# Patient Record
Sex: Male | Born: 1945 | Race: White | Hispanic: No | Marital: Married | Smoking: Never smoker
Health system: Southern US, Community
[De-identification: ages and names within clinical notes are randomized; demographics above are authoritative.]

## PROBLEM LIST (undated history)

## (undated) DIAGNOSIS — C4359 Malignant melanoma of other part of trunk: Secondary | ICD-10-CM

## (undated) DIAGNOSIS — C4339 Malignant melanoma of other parts of face: Secondary | ICD-10-CM

## (undated) DIAGNOSIS — K219 Gastro-esophageal reflux disease without esophagitis: Secondary | ICD-10-CM

## (undated) DIAGNOSIS — M5136 Other intervertebral disc degeneration, lumbar region: Secondary | ICD-10-CM

## (undated) DIAGNOSIS — I10 Essential (primary) hypertension: Secondary | ICD-10-CM

## (undated) DIAGNOSIS — M51369 Other intervertebral disc degeneration, lumbar region without mention of lumbar back pain or lower extremity pain: Secondary | ICD-10-CM

## (undated) DIAGNOSIS — E785 Hyperlipidemia, unspecified: Secondary | ICD-10-CM

## (undated) DIAGNOSIS — R062 Wheezing: Secondary | ICD-10-CM

## (undated) DIAGNOSIS — M1990 Unspecified osteoarthritis, unspecified site: Secondary | ICD-10-CM

## (undated) DIAGNOSIS — C4330 Malignant melanoma of unspecified part of face: Secondary | ICD-10-CM

## (undated) DIAGNOSIS — M5126 Other intervertebral disc displacement, lumbar region: Secondary | ICD-10-CM

## (undated) HISTORY — PX: MELANOMA EXCISION: SHX5266

## (undated) HISTORY — DX: Essential (primary) hypertension: I10

## (undated) HISTORY — DX: Unspecified osteoarthritis, unspecified site: M19.90

## (undated) HISTORY — PX: CARDIAC CATHETERIZATION: SHX172

## (undated) HISTORY — DX: Hyperlipidemia, unspecified: E78.5

## (undated) HISTORY — PX: INGUINAL HERNIA REPAIR: SUR1180

---

## 1999-11-22 ENCOUNTER — Emergency Department (HOSPITAL_COMMUNITY): Admission: EM | Admit: 1999-11-22 | Discharge: 1999-11-22 | Payer: Self-pay | Admitting: Emergency Medicine

## 1999-11-22 ENCOUNTER — Encounter: Payer: Self-pay | Admitting: Emergency Medicine

## 2000-05-20 ENCOUNTER — Encounter (INDEPENDENT_AMBULATORY_CARE_PROVIDER_SITE_OTHER): Payer: Self-pay

## 2000-05-20 ENCOUNTER — Ambulatory Visit (HOSPITAL_COMMUNITY): Admission: RE | Admit: 2000-05-20 | Discharge: 2000-05-20 | Payer: Self-pay | Admitting: Gastroenterology

## 2001-02-24 ENCOUNTER — Emergency Department (HOSPITAL_COMMUNITY): Admission: EM | Admit: 2001-02-24 | Discharge: 2001-02-24 | Payer: Self-pay | Admitting: Emergency Medicine

## 2004-06-21 ENCOUNTER — Emergency Department (HOSPITAL_COMMUNITY): Admission: EM | Admit: 2004-06-21 | Discharge: 2004-06-21 | Payer: Self-pay | Admitting: Emergency Medicine

## 2005-10-05 ENCOUNTER — Emergency Department (HOSPITAL_COMMUNITY): Admission: EM | Admit: 2005-10-05 | Discharge: 2005-10-06 | Payer: Self-pay | Admitting: Emergency Medicine

## 2006-09-02 ENCOUNTER — Encounter: Admission: RE | Admit: 2006-09-02 | Discharge: 2006-09-02 | Payer: Self-pay | Admitting: Internal Medicine

## 2007-03-20 ENCOUNTER — Encounter: Admission: RE | Admit: 2007-03-20 | Discharge: 2007-03-20 | Payer: Self-pay | Admitting: Internal Medicine

## 2007-03-26 ENCOUNTER — Encounter (INDEPENDENT_AMBULATORY_CARE_PROVIDER_SITE_OTHER): Payer: Self-pay | Admitting: Specialist

## 2007-03-26 ENCOUNTER — Encounter: Admission: RE | Admit: 2007-03-26 | Discharge: 2007-03-26 | Payer: Self-pay | Admitting: Internal Medicine

## 2008-09-19 ENCOUNTER — Encounter: Payer: Self-pay | Admitting: Internal Medicine

## 2009-09-27 ENCOUNTER — Ambulatory Visit: Payer: Self-pay | Admitting: Internal Medicine

## 2009-09-27 DIAGNOSIS — R0602 Shortness of breath: Secondary | ICD-10-CM

## 2009-10-10 ENCOUNTER — Telehealth (INDEPENDENT_AMBULATORY_CARE_PROVIDER_SITE_OTHER): Payer: Self-pay | Admitting: *Deleted

## 2009-10-11 ENCOUNTER — Ambulatory Visit: Payer: Self-pay | Admitting: Cardiology

## 2009-10-11 ENCOUNTER — Ambulatory Visit (HOSPITAL_COMMUNITY): Admission: RE | Admit: 2009-10-11 | Discharge: 2009-10-11 | Payer: Self-pay | Admitting: Internal Medicine

## 2009-10-11 ENCOUNTER — Encounter (HOSPITAL_COMMUNITY): Admission: RE | Admit: 2009-10-11 | Discharge: 2009-12-15 | Payer: Self-pay | Admitting: Internal Medicine

## 2009-10-11 ENCOUNTER — Ambulatory Visit: Payer: Self-pay

## 2009-10-11 ENCOUNTER — Encounter: Payer: Self-pay | Admitting: Internal Medicine

## 2009-12-16 HISTORY — PX: TOTAL KNEE ARTHROPLASTY: SHX125

## 2010-07-03 ENCOUNTER — Inpatient Hospital Stay (HOSPITAL_COMMUNITY): Admission: RE | Admit: 2010-07-03 | Discharge: 2010-07-08 | Payer: Self-pay | Admitting: Orthopedic Surgery

## 2011-02-18 ENCOUNTER — Other Ambulatory Visit: Payer: Self-pay | Admitting: Dermatology

## 2011-03-02 LAB — CBC
HCT: 27.7 % — ABNORMAL LOW (ref 39.0–52.0)
HCT: 29.5 % — ABNORMAL LOW (ref 39.0–52.0)
HCT: 32.3 % — ABNORMAL LOW (ref 39.0–52.0)
Hemoglobin: 10.1 g/dL — ABNORMAL LOW (ref 13.0–17.0)
Hemoglobin: 11 g/dL — ABNORMAL LOW (ref 13.0–17.0)
Hemoglobin: 9.4 g/dL — ABNORMAL LOW (ref 13.0–17.0)
MCH: 33.5 pg (ref 26.0–34.0)
MCH: 33.6 pg (ref 26.0–34.0)
MCH: 33.6 pg (ref 26.0–34.0)
MCHC: 34 g/dL (ref 30.0–36.0)
MCHC: 34 g/dL (ref 30.0–36.0)
MCHC: 34.2 g/dL (ref 30.0–36.0)
MCV: 98.2 fL (ref 78.0–100.0)
MCV: 98.8 fL (ref 78.0–100.0)
MCV: 98.8 fL (ref 78.0–100.0)
Platelets: 109 10*3/uL — ABNORMAL LOW (ref 150–400)
Platelets: 114 10*3/uL — ABNORMAL LOW (ref 150–400)
Platelets: 136 10*3/uL — ABNORMAL LOW (ref 150–400)
RBC: 2.81 MIL/uL — ABNORMAL LOW (ref 4.22–5.81)
RBC: 3.01 MIL/uL — ABNORMAL LOW (ref 4.22–5.81)
RBC: 3.27 MIL/uL — ABNORMAL LOW (ref 4.22–5.81)
RDW: 13 % (ref 11.5–15.5)
RDW: 13.1 % (ref 11.5–15.5)
RDW: 13.4 % (ref 11.5–15.5)
WBC: 7.5 10*3/uL (ref 4.0–10.5)
WBC: 7.6 10*3/uL (ref 4.0–10.5)
WBC: 8.7 10*3/uL (ref 4.0–10.5)

## 2011-03-02 LAB — BASIC METABOLIC PANEL
BUN: 10 mg/dL (ref 6–23)
BUN: 12 mg/dL (ref 6–23)
CO2: 28 mEq/L (ref 19–32)
CO2: 29 mEq/L (ref 19–32)
Calcium: 8 mg/dL — ABNORMAL LOW (ref 8.4–10.5)
Calcium: 8 mg/dL — ABNORMAL LOW (ref 8.4–10.5)
Chloride: 101 mEq/L (ref 96–112)
Chloride: 101 mEq/L (ref 96–112)
Creatinine, Ser: 0.77 mg/dL (ref 0.4–1.5)
Creatinine, Ser: 0.94 mg/dL (ref 0.4–1.5)
GFR calc Af Amer: >60 mL/min (ref >60)
GFR calc Af Amer: >60 mL/min (ref >60)
GFR calc non Af Amer: >60 mL/min (ref >60)
GFR calc non Af Amer: >60 mL/min (ref >60)
Glucose, Bld: 124 mg/dL — ABNORMAL HIGH (ref 70–99)
Glucose, Bld: 133 mg/dL — ABNORMAL HIGH (ref 70–99)
Potassium: 3.7 mEq/L (ref 3.5–5.1)
Potassium: 4.1 mEq/L (ref 3.5–5.1)
Sodium: 135 mEq/L (ref 135–145)
Sodium: 138 mEq/L (ref 135–145)

## 2011-03-02 LAB — PROTIME-INR
INR: 1.11 (ref 0.00–1.49)
INR: 1.58 — ABNORMAL HIGH (ref 0.00–1.49)
INR: 2.09 — ABNORMAL HIGH (ref 0.00–1.49)
INR: 2.12 — ABNORMAL HIGH (ref 0.00–1.49)
INR: 2.5 — ABNORMAL HIGH (ref 0.00–1.49)
Prothrombin Time: 14.2 seconds (ref 11.6–15.2)
Prothrombin Time: 18.7 seconds — ABNORMAL HIGH (ref 11.6–15.2)
Prothrombin Time: 23.3 seconds — ABNORMAL HIGH (ref 11.6–15.2)
Prothrombin Time: 23.6 seconds — ABNORMAL HIGH (ref 11.6–15.2)
Prothrombin Time: 26.8 seconds — ABNORMAL HIGH (ref 11.6–15.2)

## 2011-03-03 LAB — APTT: aPTT: 26 seconds (ref 24–37)

## 2011-03-03 LAB — ABO/RH: ABO/RH(D): O POS

## 2011-03-03 LAB — URINALYSIS, ROUTINE W REFLEX MICROSCOPIC
Glucose, UA: NEGATIVE mg/dL
Hgb urine dipstick: NEGATIVE
Specific Gravity, Urine: 1.021 (ref 1.005–1.030)
Urobilinogen, UA: 4 mg/dL — ABNORMAL HIGH (ref 0.0–1.0)

## 2011-03-03 LAB — URINE CULTURE: Culture: NO GROWTH

## 2011-03-03 LAB — DIFFERENTIAL
Basophils Absolute: 0 10*3/uL (ref 0.0–0.1)
Basophils Relative: 0 % (ref 0–1)
Eosinophils Relative: 2 % (ref 0–5)
Lymphocytes Relative: 27 % (ref 12–46)
Monocytes Absolute: 0.5 10*3/uL (ref 0.1–1.0)
Neutro Abs: 4.9 10*3/uL (ref 1.7–7.7)

## 2011-03-03 LAB — TYPE AND SCREEN: ABO/RH(D): O POS

## 2011-03-03 LAB — CBC
MCHC: 34.1 g/dL (ref 30.0–36.0)
Platelets: 187 10*3/uL (ref 150–400)
RDW: 13.3 % (ref 11.5–15.5)
WBC: 7.7 10*3/uL (ref 4.0–10.5)

## 2011-03-03 LAB — SURGICAL PCR SCREEN: Staphylococcus aureus: NEGATIVE

## 2011-03-03 LAB — BASIC METABOLIC PANEL
BUN: 15 mg/dL (ref 6–23)
Creatinine, Ser: 0.9 mg/dL (ref 0.4–1.5)
GFR calc non Af Amer: >60 mL/min (ref >60)
Glucose, Bld: 109 mg/dL — ABNORMAL HIGH (ref 70–99)

## 2011-03-03 LAB — PROTIME-INR: Prothrombin Time: 12.8 seconds (ref 11.6–15.2)

## 2011-05-03 NOTE — Procedures (Signed)
[facility]  Patient:    Stuart Haynes, Stuart Haynes                      MRN: [id number] Proc. Date: 05/20/00 Adm. Date:  [id number] Disc. Date: [id number] Attending:  Nelda Marseille CC:         Petra Kuba, M.D.             Richard A. Jacky Kindle, M.D.                           Procedure Report  PROCEDURE:  Colonoscopy with biopsy.  INDICATION:  Family history of colon cancer, colon polyps.  Consent was signed after risks, benefits, methods and options thoroughly discussed in the office.  MEDICINES USED:  Demerol 70 mg, Versed 8 mg.  DESCRIPTION OF PROCEDURE:  Rectal examination was pertinent for external hemorrhoids.  Digital exam was negative.  Video colonoscope was inserted, easily advanced around the colon to the cecum.  This did not require any position changes or any abdominal pressure.  The cecum was identified by the appendiceal orifice and the ileocecal valve.  In fact, the scope was inserted a short ways into the terminal ileum, which was normal.  Photo documentation was obtained.  The scope was slowly withdrawn.  Prep was adequate.  He did have a fair amount of liquid stool that required a fair amount of washing and suctioning, but adequate visualization was obtained.  As the scope was slowly withdrawn, in the ascending and also approximately in the splenic flexure, two edematous folds were seen.  I did not think they were polypoid but went ahead and took a few colon biopsies just to make sure.  Other than some left-sided diverticula, no other abnormalities were seen as we slowly withdrew back to the rectum.  Once back in the rectum, the scope was then retroflexed, pertinent for some internal hemorrhoids.  The scope was straightened and re-advanced a short ways around the sigmoid.  Air was suctioned, the scope removed.  The patient tolerated the procedure well.  There was no obvious immediate complication.  ENDOSCOPIC DIAGNOSES: 1. Internal and  external hemorrhoids. 2. Left-sided diverticula, mild to moderate. 3. Two questionable edematous folds, doubtful polyps, in the splenic flexure    and the ascending, status post cold biopsy. 4. Otherwise within normal limits to the terminal ileum.  PLAN:  Yearly rectals and guaiacs per Dr. Jacky Kindle.  GI follow-up p.r.n. Otherwise, probably check colonic screening in five years. DD:  05/20/00 TD:  05/23/00 Job: [id number]

## 2011-05-27 ENCOUNTER — Other Ambulatory Visit: Payer: Self-pay | Admitting: Gastroenterology

## 2011-08-27 ENCOUNTER — Other Ambulatory Visit: Payer: Self-pay | Admitting: Dermatology

## 2011-10-21 ENCOUNTER — Encounter: Payer: Self-pay | Admitting: *Deleted

## 2011-10-21 ENCOUNTER — Encounter: Payer: Self-pay | Admitting: Cardiovascular Disease

## 2011-10-21 ENCOUNTER — Ambulatory Visit (INDEPENDENT_AMBULATORY_CARE_PROVIDER_SITE_OTHER): Payer: Medicare Other | Admitting: Cardiovascular Disease

## 2011-10-21 DIAGNOSIS — R079 Chest pain, unspecified: Secondary | ICD-10-CM | POA: Insufficient documentation

## 2011-10-21 NOTE — Assessment & Plan Note (Signed)
Chest pain in a patient with obesity, HTN, HLD and stress test 2010 showing inferior scar with mild ischemia in the inferior wall. Now having daily chest pain. Will arrange left heart cath to exclude CAD in outpt cath lab on 10/30/11. R/B reviewed with pt. He agrees to proceed. Labs next week.

## 2011-10-21 NOTE — Progress Notes (Signed)
History of Present Illness:65 yo WM with history of obesity, HTN, HLD and osteoarthritis here for cardiac follow up. He has been followed in the past by Dr. Bensimhon. He was seen by Dr Bensimhon in October 2010 for further evaluation of dyspnea and pre-op evaluation for possible L knee replacement. At that time he c/o intermittent CP. Stress myoview October 2010 with inferior wall scar with mild ischemia, EF of 66%. It was felt to be a low risk study. Echo showed mild LVH but no significant valvular issues.   He has been lost to follow up for the last 2 years. He returns today for evaluation of chest pain. He tells me that he his having daily chest pains. It is a pressure sensation in the center of his chest. It occurs at rest and with exertion. No associated SOB but he does feel shortness of breath with minimal exertion.     Past Medical History  Diagnosis Date  . Arthritis   . HTN (hypertension)   . Hyperlipidemia     Past Surgical History  Procedure Date  . Total knee arthroplasty 2011    Left  . Hernia repair     Current Outpatient Prescriptions  Medication Sig Dispense Refill  . amLODipine (NORVASC) 10 MG tablet Take 10 mg by mouth daily.        . aspirin 81 MG tablet Take 81 mg by mouth daily.        . doxazosin (CARDURA) 4 MG tablet Take 4 mg by mouth at bedtime.        . fish oil-omega-3 fatty acids 1000 MG capsule Take 2 g by mouth daily.        . Glucosamine 500 MG CAPS Take by mouth. 2 tabs daily       . lisinopril-hydrochlorothiazide (PRINZIDE,ZESTORETIC) 20-12.5 MG per tablet Take 1 tablet by mouth daily.        . Naproxen Sodium (ALEVE) 220 MG CAPS Take by mouth. As  needeed       . oxyCODONE-acetaminophen (PERCOCET) 5-325 MG per tablet Take 1 tablet by mouth every 4 (four) hours as needed.        . simvastatin (ZOCOR) 20 MG tablet Take 20 mg by mouth at bedtime.          Allergies  Allergen Reactions  . Ivp Dye (Iodinated Diagnostic Agents)     History   Social  History  . Marital Status: Married    Spouse Name: N/A    Number of Children: 2  . Years of Education: N/A   Occupational History  . Retired [employer]    Social History Main Topics  . Smoking status: Never Smoker   . Smokeless tobacco: Not on file  . Alcohol Use: Yes  . Drug Use: No  . Sexually Active: Not on file   Other Topics Concern  . Not on file   Social History Narrative  . No narrative on file    Family History  Problem Relation Age of Onset  . Colon cancer Mother   . Cancer Father     Review of Systems:  As stated in the HPI and otherwise negative.   BP 137/79  Pulse 98  Ht 6' (1.829 m)  Wt 309 lb (140.161 kg)  BMI 41.91 kg/m2  Physical Examination: General: Well developed, well nourished, NAD HEENT: OP clear, mucus membranes moist SKIN: warm, dry. No rashes. Neuro: No focal deficits Musculoskeletal: Muscle strength 5/5 all ext Psychiatric: Mood and affect normal Neck:   No JVD, no carotid bruits, no thyromegaly, no lymphadenopathy. Lungs:Clear bilaterally, no wheezes, rhonci, crackles Cardiovascular: Regular rate and rhythm. No murmurs, gallops or rubs. Abdomen:Soft. Bowel sounds present. Non-tender.  Extremities: 1+ bilateral lower  extremity edema. Pulses are 1-2 + in the bilateral DP/PT.  EKG:NSR, rate 98 bpm. Non-specific ST changes.  

## 2011-10-21 NOTE — Patient Instructions (Addendum)
Your physician recommends that you schedule a follow-up appointment in: 3 weeks  Your physician has requested that you have a cardiac catheterization. Cardiac catheterization is used to diagnose and/or treat various heart conditions. Doctors may recommend this procedure for a number of different reasons. The most common reason is to evaluate chest pain. Chest pain can be a symptom of coronary artery disease (CAD), and cardiac catheterization can show whether plaque is narrowing or blocking your heart's arteries. This procedure is also used to evaluate the valves, as well as measure the blood flow and oxygen levels in different parts of your heart. For further information please visit [email or web address] Please follow instruction sheet, as given. Scheduled for October 30, 2011.  Your physician recommends that you return for lab work on Monday, October 28, 2011--BMP, CBC, PT--786.5

## 2011-10-28 ENCOUNTER — Other Ambulatory Visit (INDEPENDENT_AMBULATORY_CARE_PROVIDER_SITE_OTHER): Payer: Medicare Other | Admitting: *Deleted

## 2011-10-28 DIAGNOSIS — R079 Chest pain, unspecified: Secondary | ICD-10-CM

## 2011-10-29 LAB — BASIC METABOLIC PANEL
BUN: 18 mg/dL (ref 6–23)
CO2: 25 mEq/L (ref 19–32)
Calcium: 9 mg/dL (ref 8.4–10.5)
Creatinine, Ser: 0.8 mg/dL (ref 0.4–1.5)
GFR: 109.04 mL/min (ref >60.00)
Glucose, Bld: 113 mg/dL — ABNORMAL HIGH (ref 70–99)

## 2011-10-29 LAB — CBC WITH DIFFERENTIAL/PLATELET
Basophils Relative: 0.3 % (ref 0.0–3.0)
Eosinophils Relative: 2.8 % (ref 0.0–5.0)
HCT: 40.4 % (ref 39.0–52.0)
Hemoglobin: 13.7 g/dL (ref 13.0–17.0)
Lymphs Abs: 2.5 10*3/uL (ref 0.7–4.0)
MCV: 99.2 fl (ref 78.0–100.0)
Monocytes Absolute: 0.6 10*3/uL (ref 0.1–1.0)
Monocytes Relative: 8.9 % (ref 3.0–12.0)
Neutro Abs: 3.7 10*3/uL (ref 1.4–7.7)
WBC: 7 10*3/uL (ref 4.5–10.5)

## 2011-10-29 LAB — PROTIME-INR: INR: 1 ratio (ref 0.8–1.0)

## 2011-10-29 MED ORDER — DIAZEPAM 5 MG PO TABS: 5.0000 mg | ORAL_TABLET | ORAL | Status: AC

## 2011-10-29 MED ORDER — ASPIRIN 81 MG PO CHEW: 324.0000 mg | CHEWABLE_TABLET | ORAL | Status: AC

## 2011-10-29 MED ORDER — SODIUM CHLORIDE 0.9 % IV SOLN: INTRAVENOUS | Status: DC

## 2011-10-31 ENCOUNTER — Inpatient Hospital Stay (HOSPITAL_BASED_OUTPATIENT_CLINIC_OR_DEPARTMENT_OTHER)
Admission: RE | Admit: 2011-10-31 | Discharge: 2011-10-31 | Disposition: A | Discharge status: Home / Self Care | Payer: Medicare Other | Source: Ambulatory Visit | Attending: Cardiovascular Disease | Admitting: Cardiovascular Disease

## 2011-10-31 ENCOUNTER — Encounter (HOSPITAL_BASED_OUTPATIENT_CLINIC_OR_DEPARTMENT_OTHER)
Admission: RE | Disposition: A | Discharge status: Home / Self Care | Payer: Self-pay | Source: Ambulatory Visit | Attending: Cardiovascular Disease

## 2011-10-31 ENCOUNTER — Encounter (HOSPITAL_BASED_OUTPATIENT_CLINIC_OR_DEPARTMENT_OTHER): Payer: Self-pay

## 2011-10-31 DIAGNOSIS — I2510 Atherosclerotic heart disease of native coronary artery without angina pectoris: Secondary | ICD-10-CM | POA: Insufficient documentation

## 2011-10-31 DIAGNOSIS — R0789 Other chest pain: Secondary | ICD-10-CM | POA: Insufficient documentation

## 2011-10-31 SURGERY — JV LEFT HEART CATHETERIZATION WITH CORONARY ANGIOGRAM
Anesthesia: Moderate Sedation

## 2011-10-31 MED ORDER — SODIUM CHLORIDE 0.9 % IV SOLN
250.0000 mL | INTRAVENOUS | Status: DC
  Administered 2011-10-31: 250 mL via INTRAVENOUS

## 2011-10-31 MED ORDER — ACETAMINOPHEN 325 MG PO TABS: 650.0000 mg | ORAL_TABLET | ORAL | Status: DC | PRN

## 2011-10-31 MED ORDER — ASPIRIN 81 MG PO CHEW
324.0000 mg | CHEWABLE_TABLET | Freq: Once | ORAL | Status: AC
  Administered 2011-10-31: 324 mg via ORAL

## 2011-10-31 MED ORDER — DIAZEPAM 5 MG PO TABS
5.0000 mg | ORAL_TABLET | Freq: Once | ORAL | Status: AC
  Administered 2011-10-31: 5 mg via ORAL

## 2011-10-31 MED ORDER — SODIUM CHLORIDE 0.9 % IV SOLN: INTRAVENOUS | Status: AC

## 2011-10-31 NOTE — H&P (View-Only) (Signed)
History of Present Illness:65 yo WM with history of obesity, HTN, HLD and osteoarthritis here for cardiac follow up. Stuart Haynes has been followed in the past by Dr. Gala Romney. Stuart Haynes was seen by Dr Gala Romney in October 2010 for further evaluation of dyspnea and pre-op evaluation for possible L knee replacement. At that time Stuart Haynes c/o intermittent CP. Stress myoview October 2010 with inferior wall scar with mild ischemia, EF of 66%. It was felt to be a low risk study. Echo showed mild LVH but no significant valvular issues.   Stuart Haynes has been lost to follow up for the last 2 years. Stuart Haynes returns today for evaluation of chest pain. Stuart Haynes tells me that Stuart Haynes his having daily chest pains. It is a pressure sensation in the center of his chest. It occurs at rest and with exertion. No associated SOB but Stuart Haynes does feel shortness of breath with minimal exertion.     Past Medical History  Diagnosis Date  . Arthritis   . HTN (hypertension)   . Hyperlipidemia     Past Surgical History  Procedure Date  . Total knee arthroplasty 2011    Left  . Hernia repair     Current Outpatient Prescriptions  Medication Sig Dispense Refill  . amLODipine (NORVASC) 10 MG tablet Take 10 mg by mouth daily.        Marland Kitchen aspirin 81 MG tablet Take 81 mg by mouth daily.        Marland Kitchen doxazosin (CARDURA) 4 MG tablet Take 4 mg by mouth at bedtime.        . fish oil-omega-3 fatty acids 1000 MG capsule Take 2 g by mouth daily.        . Glucosamine 500 MG CAPS Take by mouth. 2 tabs daily       . lisinopril-hydrochlorothiazide (PRINZIDE,ZESTORETIC) 20-12.5 MG per tablet Take 1 tablet by mouth daily.        . Naproxen Sodium (ALEVE) 220 MG CAPS Take by mouth. As  needeed       . oxyCODONE-acetaminophen (PERCOCET) 5-325 MG per tablet Take 1 tablet by mouth every 4 (four) hours as needed.        . simvastatin (ZOCOR) 20 MG tablet Take 20 mg by mouth at bedtime.          Allergies  Allergen Reactions  . Ivp Dye (Iodinated Diagnostic Agents)     History   Social  History  . Marital Status: Married    Spouse Name: N/A    Number of Children: 2  . Years of Education: N/A   Occupational History  . Retired [employer]    Social History Main Topics  . Smoking status: Never Smoker   . Smokeless tobacco: Not on file  . Alcohol Use: Yes  . Drug Use: No  . Sexually Active: Not on file   Other Topics Concern  . Not on file   Social History Narrative  . No narrative on file    Family History  Problem Relation Age of Onset  . Colon cancer Mother   . Cancer Father     Review of Systems:  As stated in the HPI and otherwise negative.   BP 137/79  Pulse 98  Ht 6' (1.829 m)  Wt 309 lb (140.161 kg)  BMI 41.91 kg/m2  Physical Examination: General: Well developed, well nourished, NAD HEENT: OP clear, mucus membranes moist SKIN: warm, dry. No rashes. Neuro: No focal deficits Musculoskeletal: Muscle strength 5/5 all ext Psychiatric: Mood and affect normal Neck:  No JVD, no carotid bruits, no thyromegaly, no lymphadenopathy. Lungs:Clear bilaterally, no wheezes, rhonci, crackles Cardiovascular: Regular rate and rhythm. No murmurs, gallops or rubs. Abdomen:Soft. Bowel sounds present. Non-tender.  Extremities: 1+ bilateral lower  extremity edema. Pulses are 1-2 + in the bilateral DP/PT.  EKG:NSR, rate 98 bpm. Non-specific ST changes.

## 2011-10-31 NOTE — OR Nursing (Signed)
Discharge instructions completed, given to patient.  Ambulated to bathroom without bleeding.  Site unchanged.  IV discontinued. Discharged via wheelchair to home.

## 2011-10-31 NOTE — OR Nursing (Signed)
Bedrest begins @ 1005.

## 2011-10-31 NOTE — Op Note (Signed)
Cardiac Catheterization Operative Report  Stuart Haynes [id number] 11/15/20129:52 AM No primary provider on file.  Procedure Performed:  1. Left Heart Catheterization 2. Selective Coronary Angiography 3. Left ventricular angiogram  Operator: Verne Carrow, MD  Indication:  Chest pain, abnormal myoview suggeting ischemia 2010 without f/u.                          Procedure Details: The risks, benefits, complications, treatment options, and expected outcomes were discussed with the patient. The patient and/or family concurred with the proposed plan, giving informed consent. The patient was brought to the outpt cath lab after IV hydration was begun and oral premedication was given. The patient was further sedated with Versed and Fentanyl. The right groin  was prepped and draped in the usual manner. Using the modified Seldinger access technique, a 4 French sheath was placed in the right femoral artery. Standard diagnostic catheters were used to perform selective coronary angiography. A pigtail catheter was used to perform a left ventricular angiogram. There were no immediate complications. The patient was taken to the recovery area in stable condition.    Hemodynamic Findings: Central aortic pressure: 102/61 Left ventricular pressure: 100/9/13  Angiographic Findings:  Left main: No disease.  Left Anterior Descending Artery: 20% plaque proximal. 30% mid stenosis. Diagonal branch is small to moderate sized with mild 30% plaque.   Circumflex Artery: Mild plaque.   Right Coronary Artery: 30% proximal stenosis, eccentric.   Left Ventricular Angiogram: LVEF 55%.    Impression: 1. Mild non-obstructive CAD.  2. Normal LV function.  3. Non cardiac chest pain.   Recommendations: Medical management.        Complications:  None; patient tolerated the procedure well.

## 2011-10-31 NOTE — Interval H&P Note (Signed)
History and Physical Interval Note:   10/31/2011   9:29 AM   Stuart Haynes  has presented today for cardiac catheterization with the diagnosis of chest pain  The various methods of treatment have been discussed with the patient and family. After consideration of risks, benefits and other options for treatment, the patient has consented to  Procedure(s): JV LEFT HEART CATHETERIZATION WITH CORONARY ANGIOGRAM .  The patients' history has been reviewed, patient examined, no change in status, stable for surgery.  I have reviewed the patients' chart and labs.  Questions were answered to the patient's satisfaction.     MCALHANY,CHRISTOPHER  MD

## 2011-11-14 ENCOUNTER — Ambulatory Visit (INDEPENDENT_AMBULATORY_CARE_PROVIDER_SITE_OTHER): Payer: Medicare Other | Admitting: Cardiovascular Disease

## 2011-11-14 ENCOUNTER — Encounter: Payer: Self-pay | Admitting: Cardiovascular Disease

## 2011-11-14 VITALS — BP 129/85 | HR 95 | Ht 72.0 in | Wt 309.0 lb

## 2011-11-14 DIAGNOSIS — I2510 Atherosclerotic heart disease of native coronary artery without angina pectoris: Secondary | ICD-10-CM

## 2011-11-14 DIAGNOSIS — R079 Chest pain, unspecified: Secondary | ICD-10-CM

## 2011-11-14 MED ORDER — PANTOPRAZOLE SODIUM 40 MG PO TBEC: 40.0000 mg | DELAYED_RELEASE_TABLET | Freq: Every day | ORAL | Status: DC

## 2011-11-14 NOTE — Assessment & Plan Note (Signed)
Non-cardiac. Will start a PPI.

## 2011-11-14 NOTE — Progress Notes (Signed)
History of Present Illness: 65 yo WM with history of obesity, HTN, HLD and osteoarthritis here for cardiac follow up. He has been followed in the past by Dr. Gala Romney. He was seen by Dr Gala Romney in October 2010 for further evaluation of dyspnea and pre-op evaluation for possible L knee replacement. At that time he c/o intermittent CP. Stress myoview October 2010 with inferior wall scar with mild ischemia, EF of 66%. It was felt to be a low risk study. Echo showed mild LVH but no significant valvular issues. He was lost to follow up for two years. I saw him three weeks ago for evaluation of chest pain. He told  me that he his having daily chest pains. It is a pressure sensation in the center of his chest. It occurs at rest and with exertion. No associated SOB but he does feel shortness of breath with minimal exertion. I arranged a left heart cath on 10/31/11. This showed normal LV function, mild non-obstructive CAD. I felt that his chest pain was non-cardiac.   He is here today for follow up. He has no problems with his cath site. He has had occasional chest pains after meals. No SOB. No palpitations.    Past Medical History  Diagnosis Date  . Arthritis   . HTN (hypertension)   . Hyperlipidemia     Past Surgical History  Procedure Date  . Total knee arthroplasty 2011    Left  . Hernia repair     Current Outpatient Prescriptions  Medication Sig Dispense Refill  . amLODipine (NORVASC) 10 MG tablet Take 10 mg by mouth daily.        Marland Kitchen aspirin 81 MG tablet Take 81 mg by mouth daily.        Marland Kitchen doxazosin (CARDURA) 4 MG tablet Take 4 mg by mouth at bedtime.        . fish oil-omega-3 fatty acids 1000 MG capsule Take 2 g by mouth daily.        . Glucosamine 500 MG CAPS Take by mouth. 2 tabs daily       . lisinopril-hydrochlorothiazide (PRINZIDE,ZESTORETIC) 20-12.5 MG per tablet Take 1 tablet by mouth daily.        . Naproxen Sodium (ALEVE) 220 MG CAPS Take by mouth. As  needeed       .  oxyCODONE-acetaminophen (PERCOCET) 5-325 MG per tablet Take 1 tablet by mouth every 4 (four) hours as needed.        . simvastatin (ZOCOR) 20 MG tablet Take 20 mg by mouth at bedtime.          No Known Allergies  History   Social History  . Marital Status: Married    Spouse Name: N/A    Number of Children: 2  . Years of Education: N/A   Occupational History  . Retired [employer]    Social History Main Topics  . Smoking status: Never Smoker   . Smokeless tobacco: Not on file  . Alcohol Use: Yes  . Drug Use: No  . Sexually Active: Not on file   Other Topics Concern  . Not on file   Social History Narrative  . No narrative on file    Family History  Problem Relation Age of Onset  . Colon cancer Mother   . Cancer Father     Review of Systems:  As stated in the HPI and otherwise negative.   BP 129/85  Pulse 95  Ht 6' (1.829 m)  Wt 309 lb (  140.161 kg)  BMI 41.91 kg/m2  Physical Examination: General: Well developed, well nourished, NAD HEENT: OP clear, mucus membranes moist SKIN: warm, dry. No rashes. Neuro: No focal deficits Musculoskeletal: Muscle strength 5/5 all ext Psychiatric: Mood and affect normal Neck: No JVD, no carotid bruits, no thyromegaly, no lymphadenopathy. Lungs:Clear bilaterally, no wheezes, rhonci, crackles Cardiovascular: Regular rate and rhythm. No murmurs, gallops or rubs. Abdomen:Soft. Bowel sounds present. Non-tender.  Extremities: No lower extremity edema. Pulses are 2 + in the bilateral DP/PT.  Cardiac Cath 10/31/11:   Angiographic Findings:  Left main: No disease.  Left Anterior Descending Artery: 20% plaque proximal. 30% mid stenosis. Diagonal branch is small to moderate sized with mild 30% plaque.  Circumflex Artery: Mild plaque.  Right Coronary Artery: 30% proximal stenosis, eccentric.  Left Ventricular Angiogram: LVEF 55%.

## 2011-11-14 NOTE — Patient Instructions (Signed)
Your physician wants you to follow-up in: 12 months.  You will receive a reminder letter in the mail two months in advance. If you don't receive a letter, please call our office to schedule the follow-up appointment.  Your physician has recommended you make the following change in your medication: Start Protonix 40 mg by mouth daily   

## 2011-11-14 NOTE — Assessment & Plan Note (Addendum)
Mild disease. Continue ASA and statin.

## 2012-10-12 ENCOUNTER — Other Ambulatory Visit: Payer: Self-pay | Admitting: Dermatology

## 2012-10-28 ENCOUNTER — Other Ambulatory Visit: Payer: Self-pay | Admitting: Dermatology

## 2012-11-23 ENCOUNTER — Other Ambulatory Visit: Payer: Self-pay | Admitting: Cardiovascular Disease

## 2013-04-12 ENCOUNTER — Other Ambulatory Visit: Payer: Self-pay | Admitting: Dermatology

## 2014-02-02 ENCOUNTER — Encounter (HOSPITAL_COMMUNITY): Payer: Self-pay | Admitting: Emergency Medicine

## 2014-02-02 ENCOUNTER — Emergency Department (HOSPITAL_COMMUNITY): Payer: Medicare Other

## 2014-02-02 ENCOUNTER — Observation Stay (HOSPITAL_COMMUNITY)
Admission: EM | Admit: 2014-02-02 | Discharge: 2014-02-04 | Disposition: A | Discharge status: Home / Self Care | Payer: Medicare Other | Attending: Cardiology | Admitting: Cardiology

## 2014-02-02 DIAGNOSIS — R5383 Other fatigue: Secondary | ICD-10-CM

## 2014-02-02 DIAGNOSIS — Z7982 Long term (current) use of aspirin: Secondary | ICD-10-CM | POA: Insufficient documentation

## 2014-02-02 DIAGNOSIS — R079 Chest pain, unspecified: Secondary | ICD-10-CM | POA: Diagnosis present

## 2014-02-02 DIAGNOSIS — E785 Hyperlipidemia, unspecified: Secondary | ICD-10-CM | POA: Insufficient documentation

## 2014-02-02 DIAGNOSIS — Z9889 Other specified postprocedural states: Secondary | ICD-10-CM | POA: Insufficient documentation

## 2014-02-02 DIAGNOSIS — Z79899 Other long term (current) drug therapy: Secondary | ICD-10-CM | POA: Insufficient documentation

## 2014-02-02 DIAGNOSIS — R0602 Shortness of breath: Secondary | ICD-10-CM | POA: Insufficient documentation

## 2014-02-02 DIAGNOSIS — I2510 Atherosclerotic heart disease of native coronary artery without angina pectoris: Secondary | ICD-10-CM | POA: Insufficient documentation

## 2014-02-02 DIAGNOSIS — R5381 Other malaise: Secondary | ICD-10-CM | POA: Insufficient documentation

## 2014-02-02 DIAGNOSIS — M129 Arthropathy, unspecified: Secondary | ICD-10-CM | POA: Insufficient documentation

## 2014-02-02 DIAGNOSIS — R6883 Chills (without fever): Secondary | ICD-10-CM | POA: Insufficient documentation

## 2014-02-02 DIAGNOSIS — R0789 Other chest pain: Principal | ICD-10-CM | POA: Insufficient documentation

## 2014-02-02 DIAGNOSIS — I10 Essential (primary) hypertension: Secondary | ICD-10-CM | POA: Insufficient documentation

## 2014-02-02 HISTORY — DX: Malignant melanoma of other part of trunk: C43.59

## 2014-02-02 HISTORY — DX: Gastro-esophageal reflux disease without esophagitis: K21.9

## 2014-02-02 HISTORY — DX: Wheezing: R06.2

## 2014-02-02 HISTORY — DX: Other intervertebral disc degeneration, lumbar region without mention of lumbar back pain or lower extremity pain: M51.369

## 2014-02-02 HISTORY — DX: Other intervertebral disc displacement, lumbar region: M51.26

## 2014-02-02 HISTORY — DX: Malignant melanoma of unspecified part of face: C43.30

## 2014-02-02 HISTORY — DX: Malignant melanoma of other parts of face: C43.39

## 2014-02-02 HISTORY — DX: Other intervertebral disc degeneration, lumbar region: M51.36

## 2014-02-02 LAB — BASIC METABOLIC PANEL
BUN: 19 mg/dL (ref 6–23)
CHLORIDE: 101 meq/L (ref 96–112)
CO2: 27 mEq/L (ref 19–32)
Calcium: 9.5 mg/dL (ref 8.4–10.5)
Creatinine, Ser: 0.78 mg/dL (ref 0.50–1.35)
GFR calc Af Amer: >90 mL/min (ref >90)
GLUCOSE: 177 mg/dL — AB (ref 70–99)
POTASSIUM: 4.4 meq/L (ref 3.7–5.3)
Sodium: 142 mEq/L (ref 137–147)

## 2014-02-02 LAB — POCT I-STAT TROPONIN I: Troponin i, poc: 0.01 ng/mL (ref 0.00–0.08)

## 2014-02-02 LAB — CBC
HEMATOCRIT: 36.5 % — AB (ref 39.0–52.0)
HEMATOCRIT: 36 % — AB (ref 39.0–52.0)
HEMOGLOBIN: 11.9 g/dL — AB (ref 13.0–17.0)
HEMOGLOBIN: 11.7 g/dL — AB (ref 13.0–17.0)
MCH: 29.6 pg (ref 26.0–34.0)
MCH: 29.4 pg (ref 26.0–34.0)
MCHC: 32.6 g/dL (ref 30.0–36.0)
MCHC: 32.5 g/dL (ref 30.0–36.0)
MCV: 90.8 fL (ref 78.0–100.0)
MCV: 90.5 fL (ref 78.0–100.0)
Platelets: 211 10*3/uL (ref 150–400)
Platelets: 206 10*3/uL (ref 150–400)
RBC: 4.02 MIL/uL — ABNORMAL LOW (ref 4.22–5.81)
RBC: 3.98 MIL/uL — AB (ref 4.22–5.81)
RDW: 13.9 % (ref 11.5–15.5)
RDW: 13.8 % (ref 11.5–15.5)
WBC: 6.2 10*3/uL (ref 4.0–10.5)
WBC: 6.2 10*3/uL (ref 4.0–10.5)

## 2014-02-02 LAB — URINALYSIS, ROUTINE W REFLEX MICROSCOPIC
Bilirubin Urine: NEGATIVE
GLUCOSE, UA: NEGATIVE mg/dL
Hgb urine dipstick: NEGATIVE
Ketones, ur: NEGATIVE mg/dL
LEUKOCYTES UA: NEGATIVE
Nitrite: NEGATIVE
PH: 7.5 (ref 5.0–8.0)
Protein, ur: NEGATIVE mg/dL
SPECIFIC GRAVITY, URINE: 1.018 (ref 1.005–1.030)
Urobilinogen, UA: 1 mg/dL (ref 0.0–1.0)

## 2014-02-02 LAB — PROTIME-INR
INR: 1.01 (ref 0.00–1.49)
Prothrombin Time: 13.1 seconds (ref 11.6–15.2)

## 2014-02-02 LAB — TROPONIN I: Troponin I: <0.3 ng/mL (ref <0.30)

## 2014-02-02 LAB — CREATININE, SERUM
Creatinine, Ser: 0.81 mg/dL (ref 0.50–1.35)
GFR calc Af Amer: >90 mL/min (ref >90)
GFR calc non Af Amer: 89 mL/min — ABNORMAL LOW (ref >90)

## 2014-02-02 LAB — MAGNESIUM: Magnesium: 1.9 mg/dL (ref 1.5–2.5)

## 2014-02-02 LAB — PRO B NATRIURETIC PEPTIDE: PRO B NATRI PEPTIDE: 62.8 pg/mL (ref 0–125)

## 2014-02-02 LAB — D-DIMER, QUANTITATIVE (NOT AT ARMC)

## 2014-02-02 MED ORDER — PANTOPRAZOLE SODIUM 40 MG PO TBEC
40.0000 mg | DELAYED_RELEASE_TABLET | Freq: Every day | ORAL | Status: DC
  Administered 2014-02-03 – 2014-02-04 (×2): 40 mg via ORAL
  Filled 2014-02-02 (×3): qty 1

## 2014-02-02 MED ORDER — POTASSIUM CHLORIDE CRYS ER 20 MEQ PO TBCR
20.0000 meq | EXTENDED_RELEASE_TABLET | Freq: Two times a day (BID) | ORAL | Status: DC
  Administered 2014-02-03 – 2014-02-04 (×3): 20 meq via ORAL
  Filled 2014-02-02 (×5): qty 1

## 2014-02-02 MED ORDER — NAPROXEN SODIUM 220 MG PO CAPS: 440.0000 mg | ORAL_CAPSULE | Freq: Two times a day (BID) | ORAL | Status: DC | PRN

## 2014-02-02 MED ORDER — NAPROXEN 500 MG PO TABS
500.0000 mg | ORAL_TABLET | Freq: Two times a day (BID) | ORAL | Status: DC | PRN
  Administered 2014-02-03: 500 mg via ORAL
  Filled 2014-02-02 (×2): qty 1

## 2014-02-02 MED ORDER — ASPIRIN 81 MG PO CHEW
162.0000 mg | CHEWABLE_TABLET | Freq: Once | ORAL | Status: AC
  Administered 2014-02-02: 162 mg via ORAL
  Filled 2014-02-02: qty 2

## 2014-02-02 MED ORDER — NITROGLYCERIN 0.4 MG SL SUBL: 0.4000 mg | SUBLINGUAL_TABLET | SUBLINGUAL | Status: DC | PRN

## 2014-02-02 MED ORDER — SODIUM CHLORIDE 0.9 % IJ SOLN: 3.0000 mL | INTRAMUSCULAR | Status: DC | PRN

## 2014-02-02 MED ORDER — ASPIRIN EC 81 MG PO TBEC
81.0000 mg | DELAYED_RELEASE_TABLET | Freq: Every day | ORAL | Status: DC
  Administered 2014-02-03 – 2014-02-04 (×2): 81 mg via ORAL
  Filled 2014-02-02 (×2): qty 1

## 2014-02-02 MED ORDER — SODIUM CHLORIDE 0.9 % IV SOLN
INTRAVENOUS | Status: DC
  Administered 2014-02-02: 17:00:00 via INTRAVENOUS

## 2014-02-02 MED ORDER — DOXAZOSIN MESYLATE 4 MG PO TABS
4.0000 mg | ORAL_TABLET | Freq: Every day | ORAL | Status: DC
  Administered 2014-02-02 – 2014-02-03 (×2): 4 mg via ORAL
  Filled 2014-02-02 (×3): qty 1

## 2014-02-02 MED ORDER — ASPIRIN 300 MG RE SUPP
300.0000 mg | RECTAL | Status: AC
  Filled 2014-02-02: qty 1

## 2014-02-02 MED ORDER — ENOXAPARIN SODIUM 40 MG/0.4ML ~~LOC~~ SOLN
40.0000 mg | SUBCUTANEOUS | Status: DC
  Administered 2014-02-02 – 2014-02-03 (×2): 40 mg via SUBCUTANEOUS
  Filled 2014-02-02 (×3): qty 0.4

## 2014-02-02 MED ORDER — SODIUM CHLORIDE 0.9 % IV SOLN: 250.0000 mL | INTRAVENOUS | Status: DC | PRN

## 2014-02-02 MED ORDER — LOSARTAN POTASSIUM 50 MG PO TABS
50.0000 mg | ORAL_TABLET | Freq: Two times a day (BID) | ORAL | Status: DC
  Administered 2014-02-02 – 2014-02-04 (×4): 50 mg via ORAL
  Filled 2014-02-02 (×6): qty 1

## 2014-02-02 MED ORDER — SODIUM CHLORIDE 0.9 % IJ SOLN
3.0000 mL | Freq: Two times a day (BID) | INTRAMUSCULAR | Status: DC
  Administered 2014-02-02 – 2014-02-04 (×4): 3 mL via INTRAVENOUS

## 2014-02-02 MED ORDER — ASPIRIN 81 MG PO CHEW
324.0000 mg | CHEWABLE_TABLET | Freq: Once | ORAL | Status: DC
  Filled 2014-02-02: qty 4

## 2014-02-02 MED ORDER — ASPIRIN 81 MG PO CHEW: 324.0000 mg | CHEWABLE_TABLET | ORAL | Status: DC

## 2014-02-02 MED ORDER — ASPIRIN 81 MG PO TABS: 81.0000 mg | ORAL_TABLET | Freq: Every day | ORAL | Status: DC

## 2014-02-02 MED ORDER — SIMVASTATIN 20 MG PO TABS
20.0000 mg | ORAL_TABLET | Freq: Every day | ORAL | Status: DC
  Administered 2014-02-02 – 2014-02-03 (×2): 20 mg via ORAL
  Filled 2014-02-02 (×3): qty 1

## 2014-02-02 MED ORDER — AMLODIPINE BESYLATE 10 MG PO TABS
10.0000 mg | ORAL_TABLET | Freq: Every day | ORAL | Status: DC
  Administered 2014-02-03 – 2014-02-04 (×2): 10 mg via ORAL
  Filled 2014-02-02 (×2): qty 1

## 2014-02-02 NOTE — ED Notes (Signed)
jaquetta in main lab states she will add on BNP

## 2014-02-02 NOTE — ED Notes (Signed)
Pt reports left sided cp this morning after walking to mailbox. The pain subsided then returned while sitting in recliner. Describes the pain as sharp. States he just feel weak but denies any CP at current. No n/v/d skin is warm and dry. Pt is a x 4. In NAD

## 2014-02-02 NOTE — H&P (Addendum)
Patient seen, interviewed and examined and all labs, EKG and xrays reviewed.  Patient had several episodes of atypical sharp CP in his left axilla today that resolved with rest.  He also had some mild chest pressure.  He has had increased LE edema recently that he says his PCP started a diuretic for.  He has chronic DOE due to severe obesity and sedentary lifestyle and has DOE with minimal activity but this is chronic and has not changed.  He had a cath in 2012 that was essentially normal with nonobstructive ASCAD of 20-30%.   EKG shows nonspecific ST abnormality with no change from prior EKG and troponin is normal thus far.  His exam shows LE edema bilaterally otherwise normal exam.  I will check a BNP since the chest pressure may be due to volume overload with acute diastolic CHF given his LE edema.  I will also check a d-dimer to rule out PE due to the pleuritic component and morbid obesity and sedentary lifestyle. No IV Heparin gtt unless enzymes become positive due to very atypical nature of pain.  Continue current meds.  CHeck 2D echo in am to assess LVF and diastolic function.  NPO after MN for cath if enzymes become positive.  If enzymes are normal and echo shows normal LVF, I'm not sure any further cardiac workup is needed given cath 2 years ago with minimal non obstructive disease.  Of note a nuclear stress test before that cath was abnormal and was read out as inferior scar although LVF was normal. This was most likely diaphragmatic attenuation and soft tissue attenuation from severe obesity.

## 2014-02-02 NOTE — ED Provider Notes (Signed)
CSN: [id number]     Arrival date & time 02/02/14  1527 History   First MD Initiated Contact with Patient 02/02/14 1554     Chief Complaint  Patient presents with  . Chest Pain     (Consider location/radiation/quality/duration/timing/severity/associated sxs/prior Treatment) Patient is a 68 y.o. male presenting with chest pain. The history is provided by the patient.  Chest Pain Associated symptoms: fatigue and shortness of breath   Associated symptoms: no abdominal pain, no back pain, no fever, no nausea and not vomiting    Patient presents for left-sided chest pain that occurred this morning intermittently to [facility] and then brief periods of sharp pain occurred 3 times while walking to the mailbox. That occurred later in the morning. Patient describes the pain as sharp with underlying dull ache. Patient reports nothing last more than 5 minutes but seems to be unsure of the duration. Associated with fatigue lightheadedness and shortness of breath no nausea. Patient is followed by [facility] cardiology in the past and had a cardiac cath in 2012. Patient states that currently there is a dull ache and rates it 2-3/10. Pain is nonradiating. Past Medical History  Diagnosis Date  . Arthritis   . HTN (hypertension)   . Hyperlipidemia    Past Surgical History  Procedure Laterality Date  . Total knee arthroplasty  2011    Left  . Hernia repair     Family History  Problem Relation Age of Onset  . Colon cancer Mother   . Cancer Father   . Hypertension Mother    History  Substance Use Topics  . Smoking status: Never Smoker   . Smokeless tobacco: Not on file  . Alcohol Use: Yes    Review of Systems  Constitutional: Positive for fatigue. Negative for fever.  Eyes: Negative for redness.  Respiratory: Positive for shortness of breath.   Cardiovascular: Positive for chest pain.  Gastrointestinal: Negative for nausea, vomiting and abdominal pain.  Genitourinary: Negative for dysuria.   Musculoskeletal: Negative for back pain.  Skin: Negative for rash.  Neurological: Positive for light-headedness.  Hematological: Does not bruise/bleed easily.  Psychiatric/Behavioral: Negative for confusion.      Allergies  Review of patient's allergies indicates no known allergies.  Home Medications   Current Outpatient Rx  Name  Route  Sig  Dispense  Refill  . amLODipine (NORVASC) 10 MG tablet   Oral   Take 10 mg by mouth daily.           Marland Kitchen aspirin 81 MG tablet   Oral   Take 81 mg by mouth daily.           Marland Kitchen doxazosin (CARDURA) 4 MG tablet   Oral   Take 4 mg by mouth at bedtime.           Marland Kitchen losartan (COZAAR) 50 MG tablet   Oral   Take 50 mg by mouth 2 (two) times daily.         . Naproxen Sodium (ALEVE) 220 MG CAPS   Oral   Take 440 mg by mouth 2 (two) times daily as needed. Pain.         . pantoprazole (PROTONIX) 40 MG tablet   Oral   Take 1 tablet (40 mg total) by mouth daily.   30 tablet   11   . potassium chloride SA (K-DUR,KLOR-CON) 20 MEQ tablet   Oral   Take 20 mEq by mouth 2 (two) times daily.         Marland Kitchen  simvastatin (ZOCOR) 20 MG tablet   Oral   Take 20 mg by mouth at bedtime.            BP 137/58  Pulse 73  Temp(Src) 98.7 F (37.1 C) (Oral)  Resp 20  SpO2 95% Physical Exam  Nursing note and vitals reviewed. Constitutional: He is oriented to person, place, and time. He appears well-developed and well-nourished. No distress.  HENT:  Head: Normocephalic and atraumatic.  Eyes: Conjunctivae and EOM are normal. Pupils are equal, round, and reactive to light.  Neck: Normal range of motion.  Cardiovascular: Normal rate, regular rhythm and normal heart sounds.   No murmur heard. Pulmonary/Chest: Effort normal and breath sounds normal. No respiratory distress.  Abdominal: Soft. Bowel sounds are normal. There is no tenderness.  Musculoskeletal: Normal range of motion.  Neurological: He is alert and oriented to person, place, and  time. No cranial nerve deficit. He exhibits normal muscle tone. Coordination normal.  Skin: Skin is warm. No rash noted.    ED Course  Procedures (including critical care time) Labs Review Labs Reviewed  CBC - Abnormal; Notable for the following:    RBC 4.02 (*)    Hemoglobin 11.9 (*)    HCT 36.5 (*)    All other components within normal limits  BASIC METABOLIC PANEL - Abnormal; Notable for the following:    Glucose, Bld 177 (*)    All other components within normal limits  URINALYSIS, ROUTINE W REFLEX MICROSCOPIC  PRO B NATRIURETIC PEPTIDE  POCT I-STAT TROPONIN I   Results for orders placed during the hospital encounter of 02/02/14  CBC      Result Value Ref Range   WBC 6.2  4.0 - 10.5 K/uL   RBC 4.02 (*) 4.22 - 5.81 MIL/uL   Hemoglobin 11.9 (*) 13.0 - 17.0 g/dL   HCT 36.5 (*) 39.0 - 52.0 %   MCV 90.8  78.0 - 100.0 fL   MCH 29.6  26.0 - 34.0 pg   MCHC 32.6  30.0 - 36.0 g/dL   RDW 13.9  11.5 - 15.5 %   Platelets 211  150 - 400 K/uL  BASIC METABOLIC PANEL      Result Value Ref Range   Sodium 142  137 - 147 mEq/L   Potassium 4.4  3.7 - 5.3 mEq/L   Chloride 101  96 - 112 mEq/L   CO2 27  19 - 32 mEq/L   Glucose, Bld 177 (*) 70 - 99 mg/dL   BUN 19  6 - 23 mg/dL   Creatinine, Ser 0.78  0.50 - 1.35 mg/dL   Calcium 9.5  8.4 - 10.5 mg/dL   GFR calc non Af Amer >90  >90 mL/min   GFR calc Af Amer >90  >90 mL/min  URINALYSIS, ROUTINE W REFLEX MICROSCOPIC      Result Value Ref Range   Color, Urine YELLOW  YELLOW   APPearance CLEAR  CLEAR   Specific Gravity, Urine 1.018  1.005 - 1.030   pH 7.5  5.0 - 8.0   Glucose, UA NEGATIVE  NEGATIVE mg/dL   Hgb urine dipstick NEGATIVE  NEGATIVE   Bilirubin Urine NEGATIVE  NEGATIVE   Ketones, ur NEGATIVE  NEGATIVE mg/dL   Protein, ur NEGATIVE  NEGATIVE mg/dL   Urobilinogen, UA 1.0  0.0 - 1.0 mg/dL   Nitrite NEGATIVE  NEGATIVE   Leukocytes, UA NEGATIVE  NEGATIVE  PRO B NATRIURETIC PEPTIDE      Result Value Ref Range  Pro B Natriuretic  peptide (BNP) 62.8  0 - 125 pg/mL  POCT I-STAT TROPONIN I      Result Value Ref Range   Troponin i, poc 0.01  0.00 - 0.08 ng/mL   Comment 3             Imaging Review Dg Chest 2 View  02/02/2014   CLINICAL DATA:  Left-sided chest pain.  EXAM: CHEST  2 VIEW  COMPARISON:  CT 10/05/2005.  Chest x-ray 06/21/2004.  FINDINGS: Mediastinum and hilar structures are stable. There is stable mediastinal widening most likely prominent great vessels. However given the patient's history of chest pain chest CT should be considered for further evaluation. Borderline cardiomegaly. No pulmonary venous congestion or pleural effusion. No focal pulmonary infiltrate. No pleural effusion or pneumothorax. Degenerative changes thoracic spine.  IMPRESSION: 1.  Borderline cardiomegaly, no CHF.  No acute pulmonary disease.  2. Stable mediastinal widening, no change from 06/21/2004. This there is most likely secondary prominent great vessels. However given the patient has history of chest pain, chest CT should be considered for further evaluation.   Electronically Signed   By: Marcello Moores  Register   On: 02/02/2014 17:31    EKG Interpretation    Date/Time:  Wednesday February 02 2014 15:34:09 EST Ventricular Rate:  88 PR Interval:  144 QRS Duration: 90 QT Interval:  372 QTC Calculation: 450 R Axis:   -7 Text Interpretation:  Normal sinus rhythm Nonspecific ST and T wave abnormality Abnormal ECG Confirmed by Bryann Gentz  MD, Shevy Yaney ([id number]) on 02/02/2014 4:26:23 PM            MDM   Final diagnoses:  Chest pain    Cardiology will see the patient currently the plan is admission for rule out overnight. 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 This was done by Dr. Kinnie Scales. Patient does have cardiac risk factors of hypertension and hyperlipidemia. Patient with onset of unusual chest pain this morning starting at about 8 in  the morning. Patient describes a left-sided chest ache and occasionally a sharp pain. Had 3 sharp pains while walking to the mailbox. Associated with shortness of breath lightheadedness no nausea. Patient's also been feeling fatigued and had chills. But denies any flulike type symptoms. The symptoms reoccurred at 10 in the morning. Patient states that nothing last more than 5 minutes. Talk about having left-sided chest discomfort while I was interviewing him and that was more than 5 minutes. Patient's first troponin is negative EKG without acute changes. Chest x-rays negative for pneumonia or pulmonary edema there is some borderline cardiomegaly.    Mervin Kung, MD 02/02/14 2012

## 2014-02-02 NOTE — ED Notes (Signed)
Cardiology at bedside.

## 2014-02-02 NOTE — H&P (Signed)
Patient ID: Roy Tokarz MRN: [id number], DOB/AGE: 05/29/1946   Admit date: 02/02/2014   Primary Physician: Geoffery Lyons, MD Primary Cardiologist: Dr. Julianne Handler  Pt. Profile: 68 yo WM with a history of morbid obesity, HTN, HLD and osteoarthritis who presented to [facility] ED today complaining of chest pain. He has been having left sided, sharp chest pain x2 days associated with left arm numbness. The pain comes and goes. When he was walking to the mailbox today he felt three sharp pains associated with lightheadedness. He is not active at home due to knee arthritis and SOB. The most he is able to do is walk to the mailbox and care for his wife, who is on dialysis and showing early signs of alzheimers. His chest pain is not exertional. He denies diaphoresis, n/v, or abdominal pain. It is not assocatied with meals, lying flat or in any certain position. Recently, he had a stomach virus with diarrhea. No blood in the stool or urine. He does have LE swelling, which is chronic. He has no family history of heart disease and does not smoke. He does admit to a lot of stress in his life caring for his sick wife.   He has been evaluated for chest pain in the past and has been seen by Dr. Haroldine Laws and Dr. Julianne Handler, but he states this pain is different. He had a stress myoview October 2010 with inferior wall scar with mild ischemia, EF of 66%. It was felt to be a low risk study. Echo showed mild LVH but no significant valvular issues. He had a cardiac cath in 10/2011 which showed normal LV function, mild non-obstructive CAD. It was felt that his chest pain was non-cardiac.     Problem List  Past Medical History  Diagnosis Date  . Arthritis   . HTN (hypertension)   . Hyperlipidemia     Past Surgical History  Procedure Laterality Date  . Total knee arthroplasty  2011    Left  . Hernia repair       Allergies  No Known Allergies   Home Medications  Prior to Admission medications     Medication Sig Start Date End Date Taking? Authorizing Provider  amLODipine (NORVASC) 10 MG tablet Take 10 mg by mouth daily.     Yes Historical Provider, MD  aspirin 81 MG tablet Take 81 mg by mouth daily.     Yes Historical Provider, MD  doxazosin (CARDURA) 4 MG tablet Take 4 mg by mouth at bedtime.     Yes Historical Provider, MD  losartan (COZAAR) 50 MG tablet Take 50 mg by mouth 2 (two) times daily.   Yes Historical Provider, MD  Naproxen Sodium (ALEVE) 220 MG CAPS Take 440 mg by mouth 2 (two) times daily as needed. Pain.   Yes Historical Provider, MD  pantoprazole (PROTONIX) 40 MG tablet Take 1 tablet (40 mg total) by mouth daily. 11/14/11 02/02/14 Yes Burnell Blanks, MD  potassium chloride SA (K-DUR,KLOR-CON) 20 MEQ tablet Take 20 mEq by mouth 2 (two) times daily.   Yes Historical Provider, MD  simvastatin (ZOCOR) 20 MG tablet Take 20 mg by mouth at bedtime.     Yes Historical Provider, MD    Family History  Family History  Problem Relation Age of Onset  . Colon cancer Mother   . Cancer Father   . Hypertension Mother     Social History  History   Social History  . Marital Status: Married    Spouse  Name: N/A    Number of Children: 2  . Years of Education: N/A   Occupational History  . Retired [employer]    Social History Main Topics  . Smoking status: Never Smoker   . Smokeless tobacco: Not on file  . Alcohol Use: Yes  . Drug Use: No  . Sexual Activity: Not on file   Other Topics Concern  . Not on file   Social History Narrative  . No narrative on file     All other systems reviewed and are otherwise negative except as noted above.  Physical Exam  Blood pressure 137/58, pulse 73, temperature 98.7 F (37.1 C), temperature source Oral, resp. rate 20, SpO2 95.00%.  General: Pleasant, NAD, obese. Psych: Normal affect. Neuro: Alert and oriented X 3. Moves all extremities spontaneously. HEENT: Normal  Neck: Supple without bruits or JVD. Lungs:  Resp  regular and unlabored, CTA. Heart: RRR no s3, s4, or murmurs. Abdomen: Soft, non-tender, non-distended, BS + x 4.  Extremities: No clubbing, cyanosis or 2+ edema. DP/PT/Radials 2+ and equal bilaterally.  Labs  No results found for this basename: CKTOTAL, CKMB, TROPONINI,  in the last 72 hours Lab Results  Component Value Date   WBC 6.2 02/02/2014   HGB 11.9* 02/02/2014   HCT 36.5* 02/02/2014   MCV 90.8 02/02/2014   PLT 211 02/02/2014     Recent Labs Lab 02/02/14 1541  NA 142  K 4.4  CL 101  CO2 27  BUN 19  CREATININE 0.78  CALCIUM 9.5  GLUCOSE 177*     Radiology/Studies  10/2011 Cardiac Catheterization Operative Report  Jasman Calley  [id number]  11/15/20129:52 AM  No primary provider on file.  Procedure Performed:  1. Left Heart Catheterization 2. Selective Coronary Angiography 3. Left ventricular angiogram Operator: Lauree Chandler, MD  Indication: Chest pain, abnormal myoview suggeting ischemia 2010 without f/u.  Procedure Details:  The risks, benefits, complications, treatment options, and expected outcomes were discussed with the patient. The patient and/or family concurred with the proposed plan, giving informed consent. The patient was brought to the outpt cath lab after IV hydration was begun and oral premedication was given. The patient was further sedated with Versed and Fentanyl. The right groin was prepped and draped in the usual manner. Using the modified Seldinger access technique, a 4 French sheath was placed in the right femoral artery. Standard diagnostic catheters were used to perform selective coronary angiography. A pigtail catheter was used to perform a left ventricular angiogram. There were no immediate complications. The patient was taken to the recovery area in stable condition.  Hemodynamic Findings:  Central aortic pressure: 102/61  Left ventricular pressure: 100/9/13  Angiographic Findings:  Left main: No disease.  Left Anterior  Descending Artery: 20% plaque proximal. 30% mid stenosis. Diagonal branch is small to moderate sized with mild 30% plaque.  Circumflex Artery: Mild plaque.  Right Coronary Artery: 30% proximal stenosis, eccentric.  Left Ventricular Angiogram: LVEF 55%.  Impression:  1. Mild non-obstructive CAD.  2. Normal LV function.  3. Non cardiac chest pain.    Dg Chest 2 View  02/02/2014   CLINICAL DATA:  Left-sided chest pain.  EXAM: CHEST  2 VIEW  COMPARISON:  CT 10/05/2005.  Chest x-ray 06/21/2004.  FINDINGS: Mediastinum and hilar structures are stable. There is stable mediastinal widening most likely prominent great vessels. However given the patient's history of chest pain chest CT should be considered for further evaluation. Borderline cardiomegaly. No pulmonary venous congestion or pleural effusion.  No focal pulmonary infiltrate. No pleural effusion or pneumothorax. Degenerative changes thoracic spine.  IMPRESSION: 1.  Borderline cardiomegaly, no CHF.  No acute pulmonary disease.  2. Stable mediastinal widening, no change from 06/21/2004. This there is most likely secondary prominent great vessels. However given the patient has history of chest pain, chest CT should be considered for further evaluation.   ECG HR 88 Normal sinus rhythm Nonspecific ST and T wave abnormality  ASSESSMENT AND PLAN  68 yo WM with history of obesity, HTN, HLD and osteoarthritis who presented to [facility] ED today complaining of chest pain.   Chest pain -- Troponin x1 negative, ECG with no acute ST or TW changes -- Will admit to telemtry for observation  -- Cycle Troponin and serial ECGs -- ECHO to assess LV function and wall motion -- Will order a BNP to evaluate for heart failure as he does have marked LE edema -- Will order D-Dimer to rule out PE as he is obese and sedentary. If elevated will order CT. -- If he rules out, may consider a stress test in the morning. Will make NPO after midnight.  -- Abnormal chest x-ray,  stable from previous, but may want to follow up with a CT at some point.    SignedPerry Mount, PA-C 02/02/2014, 7:53 PM  Pager [phone]

## 2014-02-03 ENCOUNTER — Observation Stay (HOSPITAL_COMMUNITY): Payer: Medicare Other

## 2014-02-03 DIAGNOSIS — E785 Hyperlipidemia, unspecified: Secondary | ICD-10-CM | POA: Diagnosis present

## 2014-02-03 DIAGNOSIS — I517 Cardiomegaly: Secondary | ICD-10-CM

## 2014-02-03 DIAGNOSIS — I10 Essential (primary) hypertension: Secondary | ICD-10-CM | POA: Diagnosis present

## 2014-02-03 DIAGNOSIS — I2510 Atherosclerotic heart disease of native coronary artery without angina pectoris: Secondary | ICD-10-CM

## 2014-02-03 LAB — CBC
HCT: 34.6 % — ABNORMAL LOW (ref 39.0–52.0)
Hemoglobin: 11.2 g/dL — ABNORMAL LOW (ref 13.0–17.0)
MCH: 29.3 pg (ref 26.0–34.0)
MCHC: 32.4 g/dL (ref 30.0–36.0)
MCV: 90.6 fL (ref 78.0–100.0)
Platelets: 179 10*3/uL (ref 150–400)
RBC: 3.82 MIL/uL — ABNORMAL LOW (ref 4.22–5.81)
RDW: 13.8 % (ref 11.5–15.5)
WBC: 6.3 10*3/uL (ref 4.0–10.5)

## 2014-02-03 LAB — COMPREHENSIVE METABOLIC PANEL
ALT: 21 U/L (ref 0–53)
AST: 23 U/L (ref 0–37)
Albumin: 3.6 g/dL (ref 3.5–5.2)
Alkaline Phosphatase: 71 U/L (ref 39–117)
BUN: 19 mg/dL (ref 6–23)
CALCIUM: 9.4 mg/dL (ref 8.4–10.5)
CO2: 28 meq/L (ref 19–32)
Chloride: 101 mEq/L (ref 96–112)
Creatinine, Ser: 0.78 mg/dL (ref 0.50–1.35)
GFR calc Af Amer: >90 mL/min (ref >90)
GLUCOSE: 144 mg/dL — AB (ref 70–99)
Potassium: 4 mEq/L (ref 3.7–5.3)
SODIUM: 142 meq/L (ref 137–147)
Total Bilirubin: 0.4 mg/dL (ref 0.3–1.2)
Total Protein: 6.6 g/dL (ref 6.0–8.3)

## 2014-02-03 LAB — LIPID PANEL
Cholesterol: 128 mg/dL (ref 0–200)
HDL: 32 mg/dL — ABNORMAL LOW (ref >39)
LDL Cholesterol: 62 mg/dL (ref 0–99)
Total CHOL/HDL Ratio: 4 RATIO
Triglycerides: 169 mg/dL — ABNORMAL HIGH (ref <150)
VLDL: 34 mg/dL (ref 0–40)

## 2014-02-03 LAB — HEMOGLOBIN A1C
HEMOGLOBIN A1C: 7.3 % — AB (ref <5.7)
Mean Plasma Glucose: 163 mg/dL — ABNORMAL HIGH (ref <117)

## 2014-02-03 LAB — TSH: TSH: 3 u[IU]/mL (ref 0.350–4.500)

## 2014-02-03 LAB — TROPONIN I: Troponin I: <0.3 ng/mL (ref <0.30)

## 2014-02-03 MED ORDER — REGADENOSON 0.4 MG/5ML IV SOLN
INTRAVENOUS | Status: AC
  Filled 2014-02-03: qty 5

## 2014-02-03 MED ORDER — REGADENOSON 0.4 MG/5ML IV SOLN
0.4000 mg | Freq: Once | INTRAVENOUS | Status: AC
  Administered 2014-02-03: 0.4 mg via INTRAVENOUS
  Filled 2014-02-03: qty 5

## 2014-02-03 NOTE — Progress Notes (Signed)
SUBJECTIVE:  Shortlived left sided chest pain  OBJECTIVE:   Vitals:   Filed Vitals:   02/02/14 1845 02/02/14 1849 02/02/14 2111 02/03/14 0527  BP: 111/57 137/58 130/56 118/66  Pulse: 73   70  Temp:  98.7 F (37.1 C) 97.9 F (36.6 C) 100.9 F (38.3 C)  TempSrc:  Oral Oral Oral  Resp:  20 16 18   Height:   6' (1.829 m)   Weight:   331 lb (150.141 kg) 331 lb (150.141 kg)  SpO2: 97% 95% 95% 97%   I&O's:  No intake or output data in the 24 hours ending 02/03/14 0933 TELEMETRY: Reviewed telemetry pt in NSR:     PHYSICAL EXAM General: Well developed, well nourished, in no acute distress Head:   Normal cephalic and atramatic  Lungs:   Clear bilaterally to auscultation and percussion. Heart:  HRRR S1 S2 No JVD.  Distant heart sounds. Abdomen: obese,abdomen soft and non-tende Msk:  Back normal, . Normal strength and tone for age. Extremities:  Bilateral pitting edema.  DP +1 Neuro: Alert and oriented X 3. Psych:  Good affect, responds appropriately   LABS: Basic Metabolic Panel:  Recent Labs  02/02/14 1541 02/02/14 2210 02/03/14 0450  NA 142  --  142  K 4.4  --  4.0  CL 101  --  101  CO2 27  --  28  GLUCOSE 177*  --  144*  BUN 19  --  19  CREATININE 0.78 0.81 0.78  CALCIUM 9.5  --  9.4  MG  --  1.9  --    Liver Function Tests:  Recent Labs  02/03/14 0450  AST 23  ALT 21  ALKPHOS 71  BILITOT 0.4  PROT 6.6  ALBUMIN 3.6   No results found for this basename: LIPASE, AMYLASE,  in the last 72 hours CBC:  Recent Labs  02/02/14 2210 02/03/14 0450  WBC 6.2 6.3  HGB 11.7* 11.2*  HCT 36.0* 34.6*  MCV 90.5 90.6  PLT 206 179   Cardiac Enzymes:  Recent Labs  02/02/14 2210 02/03/14 0450  TROPONINI <0.30 <0.30   BNP: No components found with this basename: POCBNP,  D-Dimer:  Recent Labs  02/02/14 2210  DDIMER <0.27   Hemoglobin A1C: No results found for this basename: HGBA1C,  in the last 72 hours Fasting Lipid Panel:  Recent Labs   02/03/14 0450  CHOL 128  HDL 32*  LDLCALC 62  TRIG 169*  CHOLHDL 4.0   Thyroid Function Tests: No results found for this basename: TSH, T4TOTAL, FREET3, T3FREE, THYROIDAB,  in the last 72 hours Anemia Panel: No results found for this basename: VITAMINB12, FOLATE, FERRITIN, TIBC, IRON, RETICCTPCT,  in the last 72 hours Coag Panel:   Lab Results  Component Value Date   INR 1.01 02/02/2014   INR 1.0 10/28/2011   INR 2.12* 07/08/2010    RADIOLOGY: Dg Chest 2 View  02/02/2014   CLINICAL DATA:  Left-sided chest pain.  EXAM: CHEST  2 VIEW  COMPARISON:  CT 10/05/2005.  Chest x-ray 06/21/2004.  FINDINGS: Mediastinum and hilar structures are stable. There is stable mediastinal widening most likely prominent great vessels. However given the patient's history of chest pain chest CT should be considered for further evaluation. Borderline cardiomegaly. No pulmonary venous congestion or pleural effusion. No focal pulmonary infiltrate. No pleural effusion or pneumothorax. Degenerative changes thoracic spine.  IMPRESSION: 1.  Borderline cardiomegaly, no CHF.  No acute pulmonary disease.  2. Stable mediastinal widening, no change  from 06/21/2004. This there is most likely secondary prominent great vessels. However given the patient has history of chest pain, chest CT should be considered for further evaluation.   Electronically Signed   By: Marcello Moores  Register   On: 02/02/2014 17:31      ASSESSMENT: Mild CAD in the past, atypical chest pain  PLAN:  Plan for nuclear stress test today.  Likely will have to be a 2 day study due to obesity.  HTN:ARB, norvasc  Hyperlipidemia: simvastatin  Jettie Booze., MD  02/03/2014  9:33 AM

## 2014-02-03 NOTE — Progress Notes (Signed)
Lexiscan Cardiolite completed without complication, day 1 of 2. Kloie Whiting PA-C

## 2014-02-03 NOTE — Progress Notes (Signed)
UR completed 

## 2014-02-03 NOTE — Progress Notes (Signed)
  Echocardiogram 2D Echocardiogram has been performed.  Stuart Haynes 02/03/2014, 12:39 PM

## 2014-02-04 ENCOUNTER — Encounter (HOSPITAL_COMMUNITY): Payer: Medicare Other

## 2014-02-04 ENCOUNTER — Observation Stay (HOSPITAL_COMMUNITY): Payer: Medicare Other | Attending: Interventional Cardiology

## 2014-02-04 ENCOUNTER — Telehealth: Payer: Self-pay | Admitting: Cardiology

## 2014-02-04 DIAGNOSIS — E785 Hyperlipidemia, unspecified: Secondary | ICD-10-CM

## 2014-02-04 DIAGNOSIS — R079 Chest pain, unspecified: Secondary | ICD-10-CM | POA: Insufficient documentation

## 2014-02-04 DIAGNOSIS — I10 Essential (primary) hypertension: Secondary | ICD-10-CM

## 2014-02-04 MED ORDER — NITROGLYCERIN 0.4 MG SL SUBL: 0.4000 mg | SUBLINGUAL_TABLET | SUBLINGUAL | Status: AC | PRN

## 2014-02-04 MED ORDER — TECHNETIUM TC 99M SESTAMIBI GENERIC - CARDIOLITE
30.0000 | Freq: Once | INTRAVENOUS | Status: AC | PRN
  Administered 2014-02-04: 30 via INTRAVENOUS

## 2014-02-04 NOTE — Telephone Encounter (Signed)
Message copied by Consuelo Pandy on Fri Feb 04, 2014 12:41 PM ------      Message from: Charlie Pitter      Created: Thu Feb 03, 2014 11:36 AM      Regarding: FYI - 2 day nuc to be read on Friday 02/04/14       Team B patient - I did stress portion today (02/03/14). I am not here tomorrow which is why I am sending this!            Dayna Dunn PA-C       ------

## 2014-02-04 NOTE — Progress Notes (Addendum)
Inpatient Diabetes Program Recommendations  AACE/ADA: New Consensus Statement on Inpatient Glycemic Control (2013)  Target Ranges:  Prepandial:   less than 140 mg/dL      Peak postprandial:   less than 180 mg/dL (1-2 hours)      Critically ill patients:  140 - 180 mg/dL   Reason for Assessment: A1C of 7.3 with no documented history of diabetes  Diabetes history: None Outpatient Diabetes medications: N/A Current orders for Inpatient glycemic control: None  Note:  A1C indicative of diagnosis of diabetes.  Request MD:  Discuss diagnosis with patient  Complete Glycemic Control Order Set-- order CBG's ac, HS, Add CHO Modified Medium to diet order  Order a Dietitian consult  Alert nursing staff to begin survival skill instruction regarding diabetes self-care.  Will need a Rx for a glucose meter  Have patient f/u with PCP as soon as possible after discharge Note that patient's PCP is Dr. Reynaldo Minium.  There is a Diabetes Educator on staff in that office who can give OP Diabetes Education follow-up.  Thank you.  Stuart Haynes S. Stuart Mates, RN, CNS, CDE Inpatient Diabetes Program, team pager [phone]  Addendum:  MD, please add a diagnosis of diabetes to the Hospital Problem List.  Thank you.  Stuart Haynes S. Stuart Thien, RN, CNS, CDE

## 2014-02-04 NOTE — Discharge Summary (Signed)
Physician Discharge Summary  Patient ID: Stuart Haynes MRN: [id number] DOB/AGE: 06/11/1946 68 y.o.  Primary Cardiologist: Dr. Julianne Haynes  Admit date: 02/02/2014 Discharge date: 02/04/2014  Admission Diagnoses: Chest Pain  Discharge Diagnoses:  Active Problems:   Chest pain   Hyperlipidemia   HTN (hypertension)   Discharged Condition: stable  Hospital Course: The patient is [name], followed by Dr. Julianne Haynes, with a history of morbid obesity, HTN, HLD and osteoarthritis. He has been evaluated for chest pain in the past. He had a stress myoview in October 2010 with inferior wall scar with mild ischemia, EF of 66%. It was felt to be a low risk study. Echo showed mild LVH but no significant valvular issues. He had a cardiac cath in 10/2011 which showed normal LV function, mild non-obstructive CAD. It was felt that his chest pain was non-cardiac.   He presented to [facility] on 02/02/14 with a complaint of left sided chest pain with associated left arm numbness. It was slightly pleuritic in nature. D-dimer was negative. Initial EKG was without ST and TW changes and initial troponin was negative. However, given his symptoms and positive cardiac risk factors, he was admitted. Cardiac enzymes were cycled and returned negative x 3. He underwent a NST. Due to his BMI and body habitus, he had to complete a 2 day stress test. The test was low risk for ischemia. EF was estimated at 67%. A 2D echo was also performed which demonstrated vigorous systolic function with an EF of 65-70%.  Wall motion was normal; there were no regional wall motion abnormalities. Doppler parameters were consistent with abnormal left ventricular relaxation (grade 1 diastolic dysfunction). His chest pain was ruled non cardiac. He was last seen and examined by Dr. Irish Haynes, who determined he was stable for discharge home.   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   2D echo 02/03/14 Study Conclusions  - Left ventricle: The cavity size was normal. Wall thickness was increased in a pattern of mild LVH. Systolic function was vigorous. The estimated ejection fraction was in the range of 65% to 70%. Wall motion was normal; there were no regional wall motion abnormalities. Doppler parameters are consistent with abnormal left ventricular relaxation (grade 1 diastolic dysfunction).   Treatments:   Discharge Exam: Blood pressure 124/57, pulse 79, temperature 97.2 F (36.2 C), temperature source Oral, resp. rate 18, height 6' (1.829 m), weight 330 lb 3.2 oz (149.778 kg), SpO2 98.00%.   Disposition: 01-Home or Self Care       Future Appointments Provider Department Dept Phone   02/22/2014 9:30 AM Stuart Junes, NP [facility] Office [phone]       Medication List         ALEVE 220 MG Caps  Generic drug:  Naproxen Sodium  Take 440 mg by mouth 2 (two) times daily as needed. Pain.     amLODipine 10 MG tablet  Commonly known as:  NORVASC  Take 10 mg by mouth daily.     aspirin 81 MG tablet  Take 81 mg by mouth daily.     doxazosin 4 MG tablet  Commonly known as:  CARDURA  Take 4 mg by  mouth at bedtime.     losartan 50 MG tablet  Commonly known as:  COZAAR  Take 50 mg by mouth 2 (two) times daily.     nitroGLYCERIN 0.4 MG SL tablet  Commonly known as:  NITROSTAT  Place 1 tablet (0.4 mg total) under the tongue every 5 (five) minutes as needed for chest pain.     pantoprazole 40 MG tablet  Commonly known as:  PROTONIX  Take 1 tablet (40 mg total) by mouth daily.     potassium chloride SA 20 MEQ tablet  Commonly known as:  K-DUR,KLOR-CON  Take 20  mEq by mouth 2 (two) times daily.     simvastatin 20 MG tablet  Commonly known as:  ZOCOR  Take 20 mg by mouth at bedtime.       Follow-up Information   Follow up with Stuart Merle, NP On 02/22/2014. (9:30 )    Specialty:  Nurse Practitioner   Contact information:   [address] [place] [address] [phone]      TIME SPENT ON DISCHARGE, INCLUDING PHYSICIAN TIME: 30 MINUTES Signed: Lyda Haynes 02/04/2014, 2:56 PM   I have examined the patient and reviewed assessment and plan and discussed with patient.  Agree with above as stated.  He felt well.  No exertional CP.  Low risk study. Given that he ruled out and had nonobstructive disease by prior cath, will plan d/c with RF modification. OK to give SL NTG.  If sx persist, can consider cath.   Stuart Haynes S.

## 2014-02-04 NOTE — Progress Notes (Signed)
Subjective: Still with intermittent left sided chest pain. Denies any other symptoms.   Objective: Vital signs in last 24 hours: Temp:  [97.6 F (36.4 C)-98.6 F (37 C)] 97.6 F (36.4 C) (02/20 0500) Pulse Rate:  [73-89] 89 (02/20 0500) Resp:  [18] 18 (02/20 0500) BP: (115-139)/(57-71) 116/71 mmHg (02/20 1021) SpO2:  [94 %-97 %] 97 % (02/20 0500) Weight:  [330 lb 3.2 oz (149.778 kg)] 330 lb 3.2 oz (149.778 kg) (02/20 0500) Last BM Date: 02/02/14  Intake/Output from previous day: 02/19 0701 - 02/20 0700 In: 2568 [P.O.:240; I.V.:2328] Out: -  Intake/Output this shift:    Medications Current Facility-Administered Medications  Medication Dose Route Frequency Provider Last Rate Last Dose  . 0.9 %  sodium chloride infusion   Intravenous Continuous Mervin Kung, MD 100 mL/hr at 02/02/14 1645    . 0.9 %  sodium chloride infusion  250 mL Intravenous PRN Perry Mount, PA-C      . amLODipine (NORVASC) tablet 10 mg  10 mg Oral Daily Perry Mount, PA-C   10 mg at 02/04/14 1021  . aspirin chewable tablet 324 mg  324 mg Oral Once Sueanne Margarita, MD      . aspirin EC tablet 81 mg  81 mg Oral Daily Perry Mount, PA-C   81 mg at 02/04/14 1021  . doxazosin (CARDURA) tablet 4 mg  4 mg Oral QHS Perry Mount, PA-C   4 mg at 02/03/14 2232  . enoxaparin (LOVENOX) injection 40 mg  40 mg Subcutaneous Q24H Perry Mount, PA-C   40 mg at 02/03/14 2232  . losartan (COZAAR) tablet 50 mg  50 mg Oral BID Perry Mount, PA-C   50 mg at 02/04/14 1021  . naproxen (NAPROSYN) tablet 500 mg  500 mg Oral BID PRN Sueanne Margarita, MD   500 mg at 02/03/14 2014  . nitroGLYCERIN (NITROSTAT) SL tablet 0.4 mg  0.4 mg Sublingual Q5 Min x 3 PRN Perry Mount, PA-C      . pantoprazole (PROTONIX) EC tablet 40 mg  40 mg Oral Daily Perry Mount, PA-C   40 mg at 02/04/14 1021  . potassium chloride SA (K-DUR,KLOR-CON) CR tablet 20 mEq  20 mEq Oral BID Perry Mount, PA-C   20 mEq at 02/04/14 1021  . simvastatin  (ZOCOR) tablet 20 mg  20 mg Oral QHS Perry Mount, PA-C   20 mg at 02/03/14 2232  . sodium chloride 0.9 % injection 3 mL  3 mL Intravenous Q12H Perry Mount, PA-C   3 mL at 02/04/14 1022  . sodium chloride 0.9 % injection 3 mL  3 mL Intravenous PRN Perry Mount, PA-C        PE: General appearance: alert, cooperative and no distress Lungs: clear to auscultation bilaterally Heart: regular rate and rhythm, S1, S2 normal, no murmur, click, rub or gallop Extremities: 1+ bilateral LEE Pulses: 2+ and symmetric Skin: warm and dry Neurologic: Grossly normal  Lab Results:   Recent Labs  02/02/14 1541 02/02/14 2210 02/03/14 0450  WBC 6.2 6.2 6.3  HGB 11.9* 11.7* 11.2*  HCT 36.5* 36.0* 34.6*  PLT 211 206 179   BMET  Recent Labs  02/02/14 1541 02/02/14 2210 02/03/14 0450  NA 142  --  142  K 4.4  --  4.0  CL 101  --  101  CO2 27  --  28  GLUCOSE 177*  --  144*  BUN 19  --  19  CREATININE 0.78 0.81 0.78  CALCIUM 9.5  --  9.4   PT/INR  Recent Labs  02/02/14 2210  LABPROT 13.1  INR 1.01   Cholesterol  Recent Labs  02/03/14 0450  CHOL 128    Assessment/Plan    Active Problems:   Chest pain   Hyperlipidemia   HTN (hypertension)  Plan: 2nd portion of stress test completed today. Results pending. If no ischemia will d/c home. If abnormal, he will need a LHC. HR and BP both stable. MD to follow.     LOS: 2 days    Brittainy M. Ladoris Gene 02/04/2014 12:58 PM  I have examined the patient and reviewed assessment and plan and discussed with patient.  Agree with above as stated.  CP better.  Low risk study.  Given that he ruled out and had nonobstructive disease by prior cath, will plan d/c with RF modification.  OK to give SL NTG at d/c.  Advik Weatherspoon S.

## 2014-02-04 NOTE — Discharge Instructions (Signed)
Diets for Diabetes, Food Labeling Look at food labels to help you decide how much of a product you can eat. You will want to check the amount of total carbohydrate in a serving to see how the food fits into your meal plan. In the list of ingredients, the ingredient present in the largest amount by weight must be listed first, followed by the other ingredients in descending order. STANDARD OF IDENTITY Most products have a list of ingredients. However, foods that the Food and Drug Administration (FDA) has given a standard of identity do not need a list of ingredients. A standard of identity means that a food must contain certain ingredients if it is called a particular name. Examples are mayonnaise, peanut butter, ketchup, jelly, and cheese. LABELING TERMS There are many terms found on food labels. Some of these terms have specific definitions. Some terms are regulated by the FDA, and the FDA has clearly specified how they can be used. Others are not regulated or well-defined and can be misleading and confusing. SPECIFICALLY DEFINED TERMS Nutritive Sweetener.  A sweetener that contains calories,such as table sugar or honey. Nonnutritive Sweetener.  A sweetener with few or no calories,such as saccharin, aspartame, sucralose, and cyclamate. LABELING TERMS REGULATED BY THE FDA Free.  The product contains only a tiny or small amount of fat, cholesterol, sodium, sugar, or calories. For example, a "fat-free" product will contain less than 0.5 g of fat per serving. Low.  A food described as "low" in fat, saturated fat, cholesterol, sodium, or calories could be eaten fairly often without exceeding dietary guidelines. For example, "low in fat" means no more than 3 g of fat per serving. Lean.  "Lean" and "extra lean" are U.S. Department of Agriculture Scientist, research (physical sciences)) terms for use on meat and poultry products. "Lean" means the product contains less than 10 g of fat, 4 g of saturated fat, and 95 mg of cholesterol  per serving. "Lean" is not as low in fat as a product labeled "low." Extra Lean.  "Extra lean" means the product contains less than 5 g of fat, 2 g of saturated fat, and 95 mg of cholesterol per serving. While "extra lean" has less fat than "lean," it is still higher in fat than a product labeled "low." Reduced, Less, Fewer.  A diet product that contains 25% less of a nutrient or calories than the regular version. For example, hot dogs might be labeled "25% less fat than our regular hot dogs." Light/Lite.  A diet product that contains  fewer calories or  the fat of the original. For example, "light in sodium" means a product with  the usual sodium. More.  One serving contains at least 10% more of the daily value of a vitamin, mineral, or fiber than usual. Good Source Of.  One serving contains 10% to 19% of the daily value for a particular vitamin, mineral, or fiber. Excellent Source Of.  One serving contains 20% or more of the daily value for a particular nutrient. Other terms used might be "high in" or "rich in." Enriched or Fortified.  The product contains added vitamins, minerals, or protein. Nutrition labeling must be used on enriched or fortified foods. Imitation.  The product has been altered so that it is lower in protein, vitamins, or minerals than the usual food,such as imitation peanut butter. Total Fat.  The number listed is the total of all fat found in a serving of the product. Under total fat, food labels must list saturated fat and  trans fat, which are associated with raising bad cholesterol and an increased risk of heart blood vessel disease. Saturated Fat.  Mainly fats from animal-based sources. Some examples are red meat, cheese, cream, whole milk, and coconut oil. Trans Fat.  Found in some fried snack foods, packaged foods, and fried restaurant foods. It is recommended you eat as close to 0 g of trans fat as possible, since it raises bad cholesterol and lowers  good cholesterol. Polyunsaturated and Monounsaturated Fats.  More healthful fats. These fats are from plant sources. Total Carbohydrate.  The number of carbohydrate grams in a serving of the product. Under total carbohydrate are listed the other carbohydrate sources, such as dietary fiber and sugars. Dietary Fiber.  A carbohydrate from plant sources. Sugars.  Sugars listed on the label contain all naturally occurring sugars as well as added sugars. LABELING TERMS NOT REGULATED BY THE FDA Sugarless.  Table sugar (sucrose) has not been added. However, the manufacturer may use another form of sugar in place of sucrose to sweeten the product. For example, sugar alcohols are used to sweeten foods. Sugar alcohols are a form of sugar but are not table sugar. If a product contains sugar alcohols in place of sucrose, it can still be labeled "sugarless." Low Salt, Salt-Free, Unsalted, No Salt, No Salt Added, Without Added Salt.  Food that is usually processed with salt has been made without salt. However, the food may contain sodium-containing additives, such as preservatives, leavening agents, or flavorings. Natural.  This term has no legal meaning. Organic.  Foods that are certified as organic have been inspected and approved by the USDA to ensure they are produced without pesticides, fertilizers containing synthetic ingredients, bioengineering, or ionizing radiation. Document Released: 12/05/2003 Document Revised: 02/24/2012 Document Reviewed: 06/22/2009 [facility] Patient Information 2014 [place], [place].  Diabetes Meal Planning Guide The diabetes meal planning guide is a tool to help you plan your meals and snacks. It is important for people with diabetes to manage their blood glucose (sugar) levels. Choosing the right foods and the right amounts throughout your day will help control your blood glucose. Eating right can even help you improve your blood pressure and reach or maintain a healthy  weight. CARBOHYDRATE COUNTING MADE EASY When you eat carbohydrates, they turn to sugar. This raises your blood glucose level. Counting carbohydrates can help you control this level so you feel better. When you plan your meals by counting carbohydrates, you can have more flexibility in what you eat and balance your medicine with your food intake. Carbohydrate counting simply means adding up the total amount of carbohydrate grams in your meals and snacks. Try to eat about the same amount at each meal. Foods with carbohydrates are listed below. Each portion below is 1 carbohydrate serving or 15 grams of carbohydrates. Ask your dietician how many grams of carbohydrates you should eat at each meal or snack. Grains and Starches  1 slice bread.   English muffin or hotdog/hamburger bun.   cup cold cereal (unsweetened).   cup cooked pasta or rice.   cup starchy vegetables (corn, potatoes, peas, beans, winter squash).  1 tortilla (6 inches).   bagel.  1 waffle or pancake (size of a CD).   cup cooked cereal.  4 to 6 small crackers. *Whole grain is recommended. Fruit  1 cup fresh unsweetened berries, melon, papaya, pineapple.  1 small fresh fruit.   banana or mango.   cup fruit juice (4 oz unsweetened).   cup canned fruit in natural  juice or water.  2 tbs dried fruit.  12 to 15 grapes or cherries. Milk and Yogurt  1 cup fat-free or 1% milk.  1 cup soy milk.  6 oz light yogurt with sugar-free sweetener.  6 oz low-fat soy yogurt.  6 oz plain yogurt. Vegetables  1 cup raw or  cup cooked is counted as 0 carbohydrates or a "free" food.  If you eat 3 or more servings at 1 meal, count them as 1 carbohydrate serving. Other Carbohydrates   oz chips or pretzels.   cup ice cream or frozen yogurt.   cup sherbet or sorbet.  2 inch square cake, no frosting.  1 tbs honey, sugar, jam, jelly, or syrup.  2 small cookies.  3 squares of graham crackers.  3 cups  popcorn.  6 crackers.  1 cup broth-based soup.  Count 1 cup casserole or other mixed foods as 2 carbohydrate servings.  Foods with less than 20 calories in a serving may be counted as 0 carbohydrates or a "free" food. You may want to purchase a book or computer software that lists the carbohydrate gram counts of different foods. In addition, the nutrition facts panel on the labels of the foods you eat are a good source of this information. The label will tell you how big the serving size is and the total number of carbohydrate grams you will be eating per serving. Divide this number by 15 to obtain the number of carbohydrate servings in a portion. Remember, 1 carbohydrate serving equals 15 grams of carbohydrate. SERVING SIZES Measuring foods and serving sizes helps you make sure you are getting the right amount of food. The list below tells how big or small some common serving sizes are.  1 oz.........4 stacked dice.  3 oz........Marland KitchenDeck of cards.  1 tsp.......Marland KitchenTip of little finger.  1 tbs......Marland KitchenMarland KitchenThumb.  2 tbs.......Marland KitchenGolf ball.   cup......Marland KitchenHalf of a fist.  1 cup.......Marland KitchenA fist. SAMPLE DIABETES MEAL PLAN Below is a sample meal plan that includes foods from the grain and starches, dairy, vegetable, fruit, and meat groups. A dietician can individualize a meal plan to fit your calorie needs and tell you the number of servings needed from each food group. However, controlling the total amount of carbohydrates in your meal or snack is more important than making sure you include all of the food groups at every meal. You may interchange carbohydrate containing foods (dairy, starches, and fruits). The meal plan below is an example of a 2000 calorie diet using carbohydrate counting. This meal plan has 17 carbohydrate servings. Breakfast  1 cup oatmeal (2 carb servings).   cup light yogurt (1 carb serving).  1 cup blueberries (1 carb serving).   cup almonds. Snack  1 large apple (2 carb  servings).  1 low-fat string cheese stick. Lunch  Chicken breast salad.  1 cup spinach.   cup chopped tomatoes.  2 oz chicken breast, sliced.  2 tbs low-fat [place] dressing.  12 whole-wheat crackers (2 carb servings).  12 to 15 grapes (1 carb serving).  1 cup low-fat milk (1 carb serving). Snack  1 cup carrots.   cup hummus (1 carb serving). Dinner  3 oz broiled salmon.  1 cup brown rice (3 carb servings). Snack  1  cups steamed broccoli (1 carb serving) drizzled with 1 tsp olive oil and lemon juice.  1 cup light pudding (2 carb servings). DIABETES MEAL PLANNING WORKSHEET Your dietician can use this worksheet to help you decide how many servings  of foods and what types of foods are right for you.  BREAKFAST Food Group and Servings / Carb Servings Grain/Starches __________________________________ Dairy __________________________________________ Vegetable ______________________________________ Fruit ___________________________________________ Meat __________________________________________ Fat ____________________________________________ LUNCH Food Group and Servings / Carb Servings Grain/Starches ___________________________________ Dairy ___________________________________________ Fruit ____________________________________________ Meat ___________________________________________ Fat _____________________________________________ Stuart Haynes Food Group and Servings / Carb Servings Grain/Starches ___________________________________ Dairy ___________________________________________ Fruit ____________________________________________ Meat ___________________________________________ Fat _____________________________________________ SNACKS Food Group and Servings / Carb Servings Grain/Starches ___________________________________ Dairy ___________________________________________ Vegetable _______________________________________ Fruit  ____________________________________________ Meat ___________________________________________ Fat _____________________________________________ DAILY TOTALS Starches _________________________ Vegetable ________________________ Fruit ____________________________ Dairy ____________________________ Meat ____________________________ Fat ______________________________ Document Released: 08/29/2005 Document Revised: 02/24/2012 Document Reviewed: 07/10/2009 [employer] Patient Information 2014 [place].

## 2014-02-22 ENCOUNTER — Encounter: Payer: Self-pay | Admitting: Nurse Practitioner

## 2014-02-22 ENCOUNTER — Ambulatory Visit (INDEPENDENT_AMBULATORY_CARE_PROVIDER_SITE_OTHER): Payer: Medicare Other | Admitting: Nurse Practitioner

## 2014-02-22 VITALS — BP 140/82 | HR 87

## 2014-02-22 DIAGNOSIS — R079 Chest pain, unspecified: Secondary | ICD-10-CM

## 2014-02-22 DIAGNOSIS — I10 Essential (primary) hypertension: Secondary | ICD-10-CM

## 2014-02-22 DIAGNOSIS — E785 Hyperlipidemia, unspecified: Secondary | ICD-10-CM

## 2014-02-22 LAB — BASIC METABOLIC PANEL
BUN: 23 mg/dL (ref 6–23)
CO2: 25 mEq/L (ref 19–32)
Calcium: 9 mg/dL (ref 8.4–10.5)
Chloride: 102 mEq/L (ref 96–112)
Creatinine, Ser: 1.1 mg/dL (ref 0.4–1.5)
GFR: 74.57 mL/min (ref >60.00)
Glucose, Bld: 199 mg/dL — ABNORMAL HIGH (ref 70–99)
Potassium: 3.5 mEq/L (ref 3.5–5.1)
Sodium: 139 mEq/L (ref 135–145)

## 2014-02-22 LAB — CBC
HCT: 36 % — ABNORMAL LOW (ref 39.0–52.0)
Hemoglobin: 11.8 g/dL — ABNORMAL LOW (ref 13.0–17.0)
MCHC: 32.8 g/dL (ref 30.0–36.0)
MCV: 89.7 fl (ref 78.0–100.0)
Platelets: 226 10*3/uL (ref 150.0–400.0)
RBC: 4.01 Mil/uL — ABNORMAL LOW (ref 4.22–5.81)
RDW: 15 % — ABNORMAL HIGH (ref 11.5–14.6)
WBC: 6.9 10*3/uL (ref 4.5–10.5)

## 2014-02-22 NOTE — Patient Instructions (Addendum)
Stay on your current medicines  We will recheck your labs today  Your stress test and your ultrasound of your heart looks pretty good   See Dr. Angelena Form in 6 months  Think about going to see Dr. Marlou Sa for your knee  Call the [place] office at [phone] if you have any questions, problems or concerns.

## 2014-02-22 NOTE — Progress Notes (Addendum)
Stuart Haynes Date of Birth: 08/03/1946 Medical Record [id number]  History of Present Illness: Stuart Haynes is seen back today for a post hospital visit. Seen for Dr. Angelena Form. He is a morbidly obese male with HTN, HLD and OA. Was cathed back in November of 2012 showing normal LV function, mild nonobstructive CAD.   Most recently presented with chest pain. Negative Myoview and grade 1 diastolic dysfunction on echo with normal EF. Risk factor modification encouraged.  Comes back today. Here alone. Moving pretty slow. Very stressed out. Wife on home dialysis and now with early Alzheimer's. Overall he has been doing ok since discharge. Had one episode of chest pain - happened at night and then still thought he had when he woke up so he took a NTG - no change. No more since. No exertional symptoms. Very limited by right knee pain - thinking about TKR. Can't exercise to lose weight.  Quite stressed with home situation.   Current Outpatient Prescriptions  Medication Sig Dispense Refill  . amLODipine (NORVASC) 10 MG tablet Take 10 mg by mouth daily.        Marland Kitchen aspirin 81 MG tablet Take 81 mg by mouth daily.        Marland Kitchen doxazosin (CARDURA) 4 MG tablet Take 4 mg by mouth at bedtime.        . furosemide (LASIX) 40 MG tablet Take 40 mg by mouth daily.       Marland Kitchen losartan (COZAAR) 50 MG tablet Take 100 mg by mouth daily.       . Naproxen Sodium (ALEVE) 220 MG CAPS Take 440 mg by mouth 2 (two) times daily as needed. Pain.      . nitroGLYCERIN (NITROSTAT) 0.4 MG SL tablet Place 1 tablet (0.4 mg total) under the tongue every 5 (five) minutes as needed for chest pain.  25 tablet  2  . simvastatin (ZOCOR) 20 MG tablet Take 20 mg by mouth at bedtime.        . pantoprazole (PROTONIX) 40 MG tablet Take 1 tablet (40 mg total) by mouth daily.  30 tablet  11  . potassium chloride SA (K-DUR,KLOR-CON) 20 MEQ tablet Take 20 mEq by mouth 2 (two) times daily.       No current facility-administered medications for this  visit.    No Known Allergies  Past Medical History  Diagnosis Date  . HTN (hypertension)   . Hyperlipidemia   . GERD (gastroesophageal reflux disease)   . Arthritis     "just about q joint I've got" (02/02/2014)  . Bulging lumbar disc   . Wheezing     "at my throat; don't know what it's from" (02/02/2014)  . Melanoma of forehead   . Melanoma of face     "between eye and nose on left face"  . Melanoma of back     Past Surgical History  Procedure Laterality Date  . Total knee arthroplasty Left 2011  . Inguinal hernia repair Right   . Cardiac catheterization  ?2012  . Melanoma excision      "2 removed from my face; 1 removed from center of my back" (02/02/2014)    History  Smoking status  . Never Smoker   Smokeless tobacco  . Never Used    History  Alcohol Use  . Yes    Comment: 02/02/2014 "used to have a beer once in awhile; haven't had one in awhile now"    Family History  Problem Relation Age of Onset  .  Colon cancer Mother   . Cancer Father   . Hypertension Mother     Review of Systems: The review of systems is per the HPI.  All other systems were reviewed and are negative.  Physical Exam: BP 140/82  Pulse 87  SpO2 98% Patient is very pleasant and in no acute distress. He is morbidly obese. Skin is warm and dry. Color is normal.  HEENT is unremarkable. Normocephalic/atraumatic. PERRL. Sclera are nonicteric. Neck is supple. No masses. No JVD. Lungs are clear. Cardiac exam shows a regular rate and rhythm. Heart tones distant. Abdomen is obese but soft. Extremities are without edema. Gait and ROM are intact. Using a cane.  No gross neurologic deficits noted.  Wt Readings from Last 3 Encounters:  02/04/14 330 lb 3.2 oz (149.778 kg)  11/14/11 309 lb (140.161 kg)  10/21/11 309 lb (140.161 kg)     LABORATORY DATA: PENDING  Lab Results  Component Value Date   WBC 6.3 02/03/2014   HGB 11.2* 02/03/2014   HCT 34.6* 02/03/2014   PLT 179 02/03/2014   GLUCOSE 144*  02/03/2014   CHOL 128 02/03/2014   TRIG 169* 02/03/2014   HDL 32* 02/03/2014   LDLCALC 62 02/03/2014   ALT 21 02/03/2014   AST 23 02/03/2014   NA 142 02/03/2014   K 4.0 02/03/2014   CL 101 02/03/2014   CREATININE 0.78 02/03/2014   BUN 19 02/03/2014   CO2 28 02/03/2014   TSH 3.000 02/02/2014   INR 1.01 02/02/2014   HGBA1C 7.3* 02/02/2014   Echo Study Conclusions  - Left ventricle: The cavity size was normal. Wall thickness was increased in a pattern of mild LVH. Systolic function was vigorous. The estimated ejection fraction was in the range of 65% to 70%. Wall motion was normal; there were no regional wall motion abnormalities. Doppler parameters are consistent with abnormal left ventricular relaxation (grade 1 diastolic dysfunction). - Aortic valve: Mildly calcified annulus. Trileaflet. No significant regurgitation. - Mitral valve: Trivial regurgitation. - Left atrium: The atrium was mildly dilated. - Right ventricle: The cavity size was mildly dilated. Systolic function was normal. Prominent apical trabeculation. - Right atrium: The atrium was mildly dilated. Central venous pressure: 57mm Hg (est). - Atrial septum: No defect or patent foramen ovale was identified. - Tricuspid valve: Trivial regurgitation. - Pulmonary arteries: Systolic pressure could not be accurately estimated. - Pericardium, extracardiac: There was no pericardial effusion.  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  Stuart Haynes   Electronically Signed By: [name] On: 02/04/2014 14:16    Assessment / Plan:  1. Atypical chest pain - low risk Myoview - favoring CV risk factor modification - he is doing ok clinically. No exertional symptoms. EKG today is stable.  Would favor current regimen. See back in 6 months or if further symptoms resolve.   2. HTN - BP  fair. He will monitor.   3. HLD  4. Morbid obesity - this is the crux of his issue - probably needs to proceed on with knee surgery in order to stay mobile so that he can try to walk and lose weight.   5. Situational stress - unfortunately, this will probably not improve.   Patient is agreeable to this plan and will call if any problems develop in the interim.   Burtis Junes, RN, [facility] [phone]  [address] [place], [place]  [address] [phone]

## 2018-03-25 ENCOUNTER — Ambulatory Visit: Payer: Medicare Other | Admitting: Physician Assistant

## 2018-03-25 ENCOUNTER — Encounter: Payer: Self-pay | Admitting: Physician Assistant

## 2018-03-25 VITALS — BP 116/68 | HR 104 | Ht 74.0 in | Wt 317.4 lb

## 2018-03-25 DIAGNOSIS — I10 Essential (primary) hypertension: Secondary | ICD-10-CM | POA: Diagnosis not present

## 2018-03-25 DIAGNOSIS — E782 Mixed hyperlipidemia: Secondary | ICD-10-CM

## 2018-03-25 DIAGNOSIS — R0602 Shortness of breath: Secondary | ICD-10-CM

## 2018-03-25 DIAGNOSIS — I5032 Chronic diastolic (congestive) heart failure: Secondary | ICD-10-CM | POA: Insufficient documentation

## 2018-03-25 DIAGNOSIS — I2510 Atherosclerotic heart disease of native coronary artery without angina pectoris: Secondary | ICD-10-CM | POA: Diagnosis not present

## 2018-03-25 DIAGNOSIS — I5033 Acute on chronic diastolic (congestive) heart failure: Secondary | ICD-10-CM

## 2018-03-25 LAB — D-DIMER, QUANTITATIVE: D-DIMER: 0.28 mg/L FEU (ref 0.00–0.49)

## 2018-03-25 MED ORDER — FUROSEMIDE 40 MG PO TABS: 40.0000 mg | ORAL_TABLET | Freq: Every day | ORAL | 3 refills | Status: DC

## 2018-03-25 NOTE — Patient Instructions (Addendum)
Medication Instructions:  Your physician has recommended you make the following change in your medication:  1- INCREASE Lasix 80 mg by mouth daily for 5 days, then back to Lasix 40 mg by mouth daily  Labwork: Your physician recommends that you have lab work today- BNP and D-dimer  Testing/Procedures: Non-Cardiac CT scanning, (CAT scanning) to rule out PE, is a noninvasive, special x-ray that produces cross-sectional images of the body using x-rays and a computer. CT scans help physicians diagnose and treat medical conditions. For some CT exams, a contrast material is used to enhance visibility in the area of the body being studied. CT scans provide greater clarity and reveal more details than regular x-ray exams.  Your physician has requested that you have an echocardiogram. Echocardiography is a painless test that uses sound waves to create images of your heart. It provides your doctor with information about the size and shape of your heart and how well your heart's chambers and valves are working. This procedure takes approximately one hour. There are no restrictions for this procedure.  Follow-Up: Your physician wants you to follow-up in: 2 to 3 weeks with Dr Angelena Form or Ermalinda Barrios PA.    Your physician wants you to go to [employer] and follow a low salt diet.   Low-Sodium Eating Plan Sodium, which is an element that makes up salt, helps you maintain a healthy balance of fluids in your body. Too much sodium can increase your blood pressure and cause fluid and waste to be held in your body. Your health care provider or dietitian may recommend following this plan if you have high blood pressure (hypertension), kidney disease, liver disease, or heart failure. Eating less sodium can help lower your blood pressure, reduce swelling, and protect your heart, liver, and kidneys. What are tips for following this plan? General guidelines  Most people on this plan should limit their sodium  intake to 1,500-2,000 mg (milligrams) of sodium each day. Reading food labels  The Nutrition Facts label lists the amount of sodium in one serving of the food. If you eat more than one serving, you must multiply the listed amount of sodium by the number of servings.  Choose foods with less than 140 mg of sodium per serving.  Avoid foods with 300 mg of sodium or more per serving. Shopping  Look for lower-sodium products, often labeled as "low-sodium" or "no salt added."  Always check the sodium content even if foods are labeled as "unsalted" or "no salt added".  Buy fresh foods. ? Avoid canned foods and premade or frozen meals. ? Avoid canned, cured, or processed meats  Buy breads that have less than 80 mg of sodium per slice. Cooking  Eat more home-cooked food and less restaurant, buffet, and fast food.  Avoid adding salt when cooking. Use salt-free seasonings or herbs instead of table salt or sea salt. Check with your health care provider or pharmacist before using salt substitutes.  Cook with plant-based oils, such as canola, sunflower, or olive oil. Meal planning  When eating at a restaurant, ask that your food be prepared with less salt or no salt, if possible.  Avoid foods that contain MSG (monosodium glutamate). MSG is sometimes added to [place] food, bouillon, and some canned foods. What foods are recommended? The items listed may not be a complete list. Talk with your dietitian about what dietary choices are best for you. Grains Low-sodium cereals, including oats, puffed wheat and rice, and shredded wheat. Low-sodium crackers. Unsalted  rice. Unsalted pasta. Low-sodium bread. Whole-grain breads and whole-grain pasta. Vegetables Fresh or frozen vegetables. "No salt added" canned vegetables. "No salt added" tomato sauce and paste. Low-sodium or reduced-sodium tomato and vegetable juice. Fruits Fresh, frozen, or canned fruit. Fruit juice. Meats and other protein  foods Fresh or frozen (no salt added) meat, poultry, seafood, and fish. Low-sodium canned tuna and salmon. Unsalted nuts. Dried peas, beans, and lentils without added salt. Unsalted canned beans. Eggs. Unsalted nut butters. Dairy Milk. Soy milk. Cheese that is naturally low in sodium, such as ricotta cheese, fresh mozzarella, or Swiss cheese Low-sodium or reduced-sodium cheese. Cream cheese. Yogurt. Fats and oils Unsalted butter. Unsalted margarine with no trans fat. Vegetable oils such as canola or olive oils. Seasonings and other foods Fresh and dried herbs and spices. Salt-free seasonings. Low-sodium mustard and ketchup. Sodium-free salad dressing. Sodium-free light mayonnaise. Fresh or refrigerated horseradish. Lemon juice. Vinegar. Homemade, reduced-sodium, or low-sodium soups. Unsalted popcorn and pretzels. Low-salt or salt-free chips. What foods are not recommended? The items listed may not be a complete list. Talk with your dietitian about what dietary choices are best for you. Grains Instant hot cereals. Bread stuffing, pancake, and biscuit mixes. Croutons. Seasoned rice or pasta mixes. Noodle soup cups. Boxed or frozen macaroni and cheese. Regular salted crackers. Self-rising flour. Vegetables Sauerkraut, pickled vegetables, and relishes. Olives. [place] fries. Onion rings. Regular canned vegetables (not low-sodium or reduced-sodium). Regular canned tomato sauce and paste (not low-sodium or reduced-sodium). Regular tomato and vegetable juice (not low-sodium or reduced-sodium). Frozen vegetables in sauces. Meats and other protein foods Meat or fish that is salted, canned, smoked, spiced, or pickled. Bacon, ham, sausage, hotdogs, corned beef, chipped beef, packaged lunch meats, salt pork, jerky, pickled herring, anchovies, regular canned tuna, sardines, salted nuts. Dairy Processed cheese and cheese spreads. Cheese curds. Blue cheese. Feta cheese. String cheese. Regular cottage cheese.  Buttermilk. Canned milk. Fats and oils Salted butter. Regular margarine. Ghee. Bacon fat. Seasonings and other foods Onion salt, garlic salt, seasoned salt, table salt, and sea salt. Canned and packaged gravies. Worcestershire sauce. Tartar sauce. Barbecue sauce. Teriyaki sauce. Soy sauce, including reduced-sodium. Steak sauce. Fish sauce. Oyster sauce. Cocktail sauce. Horseradish that you find on the shelf. Regular ketchup and mustard. Meat flavorings and tenderizers. Bouillon cubes. Hot sauce and Tabasco sauce. Premade or packaged marinades. Premade or packaged taco seasonings. Relishes. Regular salad dressings. Salsa. Potato and tortilla chips. Corn chips and puffs. Salted popcorn and pretzels. Canned or dried soups. Pizza. Frozen entrees and pot pies. Summary  Eating less sodium can help lower your blood pressure, reduce swelling, and protect your heart, liver, and kidneys.  Most people on this plan should limit their sodium intake to 1,500-2,000 mg (milligrams) of sodium each day.  Canned, boxed, and frozen foods are high in sodium. Restaurant foods, fast foods, and pizza are also very high in sodium. You also get sodium by adding salt to food.  Try to cook at home, eat more fresh fruits and vegetables, and eat less fast food, canned, processed, or prepared foods. This information is not intended to replace advice given to you by your health care provider. Make sure you discuss any questions you have with your health care provider. Document Released: 05/24/2002 Document Revised: 11/25/2016 Document Reviewed: 11/25/2016 Elsevier Interactive Patient Education  [employer].

## 2018-03-25 NOTE — Progress Notes (Signed)
Cardiology Office Note    Date:  03/25/2018   ID:  Stuart Haynes, DOB Aug 26, 1946, MRN [id number]  PCP:  Burnard Bunting, MD  Cardiologist: Lauree Chandler, MD  Chief Complaint  Patient presents with  . Congestive Heart Failure    History of Present Illness:   Stuart Haynes is a 72 y.o. male who is being seen today for the evaluation of CHF at the request of Burnard Bunting, MD.  Patient has history of morbid obesity, hypertension, HLD,  had a cath in 2012 showing normal LV function and mild nonobstructive CAD.  Was seen in 2015 in the hospital for chest pain and had a negative Myoview and 2D echo showed normal LVEF grade 1 DD.  Risk factor modification recommended.  Last seen in our office 02/2014 by Kathrynn Humble, NP was under a lot of stress with the wife on dialysis in early Alzheimer's.  Records reviewed from Dr. Joya Salm 02/25/18 felt to have CHF treated with Lasix 40 mg daily.  Chest x-ray 03/26/18 mild hyperexpansion no active disease.  Labs creatinine 0.8 glucose 152 hemoglobin stable LDL 96 TSH slightly elevated at 5.01 hemoglobin A1c high at 6.8.  Follow-up labs 03/12/18 creatinine 0.8 TSH 1.47 LDL 77.  Patient says back in late January he developed a cough and fluid build up. Started on Lasix 40 mg daily and lost 24 lbs of fluid. Breathing somewhat better but short of breath just walking into our lobby and then got in a wheelchair to come in. Eats a lot of frozen/processed foods since his wife passed away 3 years ago.  Not exercising regularly.  Very sedentary gets short of breath with little activity.  Denies any recent chest pain.  Occasionally will have it if he eats while sitting in his recliner.  Denies sleep apnea or trouble sleeping.  Past Medical History:  Diagnosis Date  . Arthritis    "just about q joint I've got" (02/02/2014)  . Bulging lumbar disc   . GERD (gastroesophageal reflux disease)   . HTN (hypertension)   . Hyperlipidemia   . Melanoma of back  ([place])   . 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 Wheezing    "at my throat; don't know what it's from" (02/02/2014)    Past Surgical History:  Procedure Laterality Date  . CARDIAC CATHETERIZATION  ?2012  . INGUINAL HERNIA REPAIR Right   . MELANOMA EXCISION     "2 removed from my face; 1 removed from center of my back" (02/02/2014)  . TOTAL KNEE ARTHROPLASTY Left 2011    Current Medications: Current Meds  Medication Sig  . amLODipine (NORVASC) 10 MG tablet Take 10 mg by mouth daily.    Marland Kitchen doxazosin (CARDURA) 4 MG tablet Take 4 mg by mouth at bedtime.    . furosemide (LASIX) 40 MG tablet Take 1 tablet (40 mg total) by mouth daily. Take 2 tablet by mouth daily for 5 days, then back to 1 tablet daily.  Marland Kitchen levothyroxine (SYNTHROID, LEVOTHROID) 25 MCG tablet Take 25 mcg by mouth daily before breakfast.  . losartan (COZAAR) 50 MG tablet Take 100 mg by mouth daily.   . Naproxen Sodium (ALEVE) 220 MG CAPS Take 440 mg by mouth 2 (two) times daily as needed. Pain.  . nitroGLYCERIN (NITROSTAT) 0.4 MG SL tablet Place 1 tablet (0.4 mg total) under the tongue every 5 (five) minutes as needed for chest pain.  Marland Kitchen  potassium chloride SA (K-DUR,KLOR-CON) 20 MEQ tablet Take 20 mEq by mouth 2 (two) times daily.  . simvastatin (ZOCOR) 20 MG tablet Take 20 mg by mouth at bedtime.    . [DISCONTINUED] furosemide (LASIX) 40 MG tablet Take 40 mg by mouth daily.      Allergies:   Patient has no known allergies.   Social History   Socioeconomic History  . Marital status: Married    Spouse name: Not on file  . Number of children: 2  . Years of education: Not on file  . Highest education level: Not on file  Occupational History  . Occupation: Retired Film/video editor  . Financial resource strain: Not on file  . Food insecurity:    Worry: Not on file    Inability: Not on file  . Transportation needs:    Medical: Not on file    Non-medical:  Not on file  Tobacco Use  . Smoking status: Never Smoker  . Smokeless tobacco: Never Used  Substance and Sexual Activity  . Alcohol use: Yes    Comment: 02/02/2014 "used to have a beer once in awhile; haven't had one in awhile now"  . Drug use: No  . Sexual activity: Not Currently  Lifestyle  . Physical activity:    Days per week: Not on file    Minutes per session: Not on file  . Stress: Not on file  Relationships  . Social connections:    Talks on phone: Not on file    Gets together: Not on file    Attends religious service: Not on file    Active member of club or organization: Not on file    Attends meetings of clubs or organizations: Not on file    Relationship status: Not on file  Other Topics Concern  . Not on file  Social History Narrative  . Not on file     Family History:  The patient's family history includes Cancer in his father; Colon cancer in his mother; Hypertension in his mother.   ROS:   Please see the history of present illness.    Review of Systems  Constitution: Negative.  HENT: Negative.   Cardiovascular: Positive for dyspnea on exertion and leg swelling.  Respiratory: Negative.   Endocrine: Negative.   Hematologic/Lymphatic: Negative.   Musculoskeletal: Positive for arthritis.  Gastrointestinal: Negative.   Genitourinary: Positive for frequency and hesitancy.  Neurological: Negative.    All other systems reviewed and are negative.   PHYSICAL EXAM:   VS:  BP 116/68   Pulse (!) 104   Ht 6\' 2"  (1.88 m)   Wt (!) 317 lb 6.4 oz (144 kg)   BMI 40.75 kg/m   Physical Exam  GEN: Obese in no acute distress  Neck: no JVD, carotid bruits, or masses Cardiac:RRR; distant heart sounds no murmurs, rubs, or gallops  Respiratory:  clear to auscultation bilaterally, normal work of breathing GI: soft, nontender, nondistended, + BS Ext: +3-4 edema bilaterally MS: no deformity or atrophy  Skin: warm and dry, no rash Neuro:  Alert and Oriented x 3 Psych:  euthymic mood, full affect  Wt Readings from Last 3 Encounters:  03/25/18 (!) 317 lb 6.4 oz (144 kg)  02/04/14 (!) 330 lb 3.2 oz (149.8 kg)  11/14/11 (!) 309 lb (140.2 kg)      Studies/Labs Reviewed:   EKG:  EKG is ordered today.  The ekg ordered today demonstrates sinus tachycardia at 104 bpm with new right bundle branch  block  Recent Labs: No results found for requested labs within last 8760 hours.   Lipid Panel    Component Value Date/Time   CHOL 128 02/03/2014 0450   TRIG 169 (H) 02/03/2014 0450   HDL 32 (L) 02/03/2014 0450   CHOLHDL 4.0 02/03/2014 0450   VLDL 34 02/03/2014 0450   LDLCALC 62 02/03/2014 0450    Additional studies/ records that were reviewed today include:  2D echo 02/03/14 Study Conclusions  - Left ventricle: The cavity size was normal. Wall thickness   was increased in a pattern of mild LVH. Systolic function   was vigorous. The estimated ejection fraction was in the   range of 65% to 70%. Wall motion was normal; there were no   regional wall motion abnormalities. Doppler parameters are   consistent with abnormal left ventricular relaxation   (grade 1 diastolic dysfunction). - Aortic valve: Mildly calcified annulus. Trileaflet. No   significant regurgitation. - Mitral valve: Trivial regurgitation. - Left atrium: The atrium was mildly dilated. - Right ventricle: The cavity size was mildly dilated.   Systolic function was normal. Prominent apical   trabeculation. - Right atrium: The atrium was mildly dilated. Central   venous pressure: 64mm Hg (est). - Atrial septum: No defect or patent foramen ovale was   identified. - Tricuspid valve: Trivial regurgitation. - Pulmonary arteries: Systolic pressure could not be   accurately estimated. - Pericardium, extracardiac: There was no pericardial   effusion. Impressions:  - Mild LVH with LVEF 31-54%, grade 1 diastolic dysfunction.   Mild biatrial enlargement. Trivial mitral and tricuspid    regurgitation. Mild RV enlargement with normal contraction   and prominent apical trabeculation. Unable to assess PASP.   Lexiscan Myoview 2/20/15IMPRESSION: Low risk lexiscan myovue with no chest pain, no ST changes and prior inferior infarct vs diaphragmatic attenuation; cannot R/O very mild inferior ischemia; EF 67%.   Brain Crenshaw     Electronically Signed   By: [name]   On: 02/04/2014 14:16   Cardiac Cath 10/31/11:   Angiographic Findings:  Left main: No disease.  Left Anterior Descending Artery: 20% plaque proximal. 30% mid stenosis. Diagonal branch is small to moderate sized with mild 30% plaque.  Circumflex Artery: Mild plaque.  Right Coronary Artery: 30% proximal stenosis, eccentric.  Left Ventricular Angiogram: LVEF 55%.       ASSESSMENT:    1. Acute on chronic diastolic CHF (congestive heart failure) ([place])   2. DYSPNEA   3. Essential hypertension   4. Coronary artery disease involving native coronary artery of native heart without angina pectoris   5. Mixed hyperlipidemia   6. Morbid obesity ([place])      PLAN:  In order of problems listed above:  Acute on chronic diastolic CHF still has quite a bit of fluid overload.  Increase Lasix to 80 mg daily for 5 days potassium 20 mEq 2 times daily for 5 days then back to Lasix 40 mg once a day.  Check 2D echo for LV function and diastolic function.  2 g sodium diet reiterated.  Patient does get a lot of processed foods.  Follow-up with myself or Dr. Angelena Form in a couple weeks  Dyspnea on exertion with little activity/new right bundle branch block and tachycardia.  Will check CT to rule out PE.  Recent creatinine was 0.8 on 03/12/18.  A lot of this could be due to deconditioning but with new right bundle branch block will check CT.  Essential hypertension blood pressure controlled  CAD nonobstructive in 2012 with normal LV function.  No angina  Mixed hyperlipidemia on Zocor managed by Dr. Reynaldo Minium  Morbid  obesity weight loss is essential for his overall health.  He was on weight watchers at one time and recommend that he restart this.    Medication Adjustments/Labs and Tests Ordered: Current medicines are reviewed at length with the patient today.  Concerns regarding medicines are outlined above.  Medication changes, Labs and Tests ordered today are listed in the Patient Instructions below. Patient Instructions  Medication Instructions:  Your physician has recommended you make the following change in your medication:  1- INCREASE Lasix 80 mg by mouth daily for 5 days, then back to Lasix 40 mg by mouth daily  Labwork: Your physician recommends that you have lab work today- BNP and D-dimer  Testing/Procedures: Non-Cardiac CT scanning, (CAT scanning) to rule out PE, is a noninvasive, special x-ray that produces cross-sectional images of the body using x-rays and a computer. CT scans help physicians diagnose and treat medical conditions. For some CT exams, a contrast material is used to enhance visibility in the area of the body being studied. CT scans provide greater clarity and reveal more details than regular x-ray exams.  Your physician has requested that you have an echocardiogram. Echocardiography is a painless test that uses sound waves to create images of your heart. It provides your doctor with information about the size and shape of your heart and how well your heart's chambers and valves are working. This procedure takes approximately one hour. There are no restrictions for this procedure.  Follow-Up: Your physician wants you to follow-up in: 2 to 3 weeks with Dr Angelena Form or Ermalinda Barrios PA.    Your physician wants you to go to Weight Watchers and follow a low salt diet.   Low-Sodium Eating Plan Sodium, which is an element that makes up salt, helps you maintain a healthy balance of fluids in your body. Too much sodium can increase your blood pressure and cause fluid and waste to be  held in your body. Your health care provider or dietitian may recommend following this plan if you have high blood pressure (hypertension), kidney disease, liver disease, or heart failure. Eating less sodium can help lower your blood pressure, reduce swelling, and protect your heart, liver, and kidneys. What are tips for following this plan? General guidelines  Most people on this plan should limit their sodium intake to 1,500-2,000 mg (milligrams) of sodium each day. Reading food labels  The Nutrition Facts label lists the amount of sodium in one serving of the food. If you eat more than one serving, you must multiply the listed amount of sodium by the number of servings.  Choose foods with less than 140 mg of sodium per serving.  Avoid foods with 300 mg of sodium or more per serving. Shopping  Look for lower-sodium products, often labeled as "low-sodium" or "no salt added."  Always check the sodium content even if foods are labeled as "unsalted" or "no salt added".  Buy fresh foods. ? Avoid canned foods and premade or frozen meals. ? Avoid canned, cured, or processed meats  Buy breads that have less than 80 mg of sodium per slice. Cooking  Eat more home-cooked food and less restaurant, buffet, and fast food.  Avoid adding salt when cooking. Use salt-free seasonings or herbs instead of table salt or sea salt. Check with your health care provider or pharmacist before using salt substitutes.  Cook with plant-based  oils, such as canola, sunflower, or olive oil. Meal planning  When eating at a restaurant, ask that your food be prepared with less salt or no salt, if possible.  Avoid foods that contain MSG (monosodium glutamate). MSG is sometimes added to [place] food, bouillon, and some canned foods. What foods are recommended? The items listed may not be a complete list. Talk with your dietitian about what dietary choices are best for you. Grains Low-sodium cereals, including  oats, puffed wheat and rice, and shredded wheat. Low-sodium crackers. Unsalted rice. Unsalted pasta. Low-sodium bread. Whole-grain breads and whole-grain pasta. Vegetables Fresh or frozen vegetables. "No salt added" canned vegetables. "No salt added" tomato sauce and paste. Low-sodium or reduced-sodium tomato and vegetable juice. Fruits Fresh, frozen, or canned fruit. Fruit juice. Meats and other protein foods Fresh or frozen (no salt added) meat, poultry, seafood, and fish. Low-sodium canned tuna and salmon. Unsalted nuts. Dried peas, beans, and lentils without added salt. Unsalted canned beans. Eggs. Unsalted nut butters. Dairy Milk. Soy milk. Cheese that is naturally low in sodium, such as ricotta cheese, fresh mozzarella, or Swiss cheese Low-sodium or reduced-sodium cheese. Cream cheese. Yogurt. Fats and oils Unsalted butter. Unsalted margarine with no trans fat. Vegetable oils such as canola or olive oils. Seasonings and other foods Fresh and dried herbs and spices. Salt-free seasonings. Low-sodium mustard and ketchup. Sodium-free salad dressing. Sodium-free light mayonnaise. Fresh or refrigerated horseradish. Lemon juice. Vinegar. Homemade, reduced-sodium, or low-sodium soups. Unsalted popcorn and pretzels. Low-salt or salt-free chips. What foods are not recommended? The items listed may not be a complete list. Talk with your dietitian about what dietary choices are best for you. Grains Instant hot cereals. Bread stuffing, pancake, and biscuit mixes. Croutons. Seasoned rice or pasta mixes. Noodle soup cups. Boxed or frozen macaroni and cheese. Regular salted crackers. Self-rising flour. Vegetables Sauerkraut, pickled vegetables, and relishes. Olives. [place] fries. Onion rings. Regular canned vegetables (not low-sodium or reduced-sodium). Regular canned tomato sauce and paste (not low-sodium or reduced-sodium). Regular tomato and vegetable juice (not low-sodium or reduced-sodium). Frozen  vegetables in sauces. Meats and other protein foods Meat or fish that is salted, canned, smoked, spiced, or pickled. Bacon, ham, sausage, hotdogs, corned beef, chipped beef, packaged lunch meats, salt pork, jerky, pickled herring, anchovies, regular canned tuna, sardines, salted nuts. Dairy Processed cheese and cheese spreads. Cheese curds. Blue cheese. Feta cheese. String cheese. Regular cottage cheese. Buttermilk. Canned milk. Fats and oils Salted butter. Regular margarine. Ghee. Bacon fat. Seasonings and other foods Onion salt, garlic salt, seasoned salt, table salt, and sea salt. Canned and packaged gravies. Worcestershire sauce. Tartar sauce. Barbecue sauce. Teriyaki sauce. Soy sauce, including reduced-sodium. Steak sauce. Fish sauce. Oyster sauce. Cocktail sauce. Horseradish that you find on the shelf. Regular ketchup and mustard. Meat flavorings and tenderizers. Bouillon cubes. Hot sauce and Tabasco sauce. Premade or packaged marinades. Premade or packaged taco seasonings. Relishes. Regular salad dressings. Salsa. Potato and tortilla chips. Corn chips and puffs. Salted popcorn and pretzels. Canned or dried soups. Pizza. Frozen entrees and pot pies. Summary  Eating less sodium can help lower your blood pressure, reduce swelling, and protect your heart, liver, and kidneys.  Most people on this plan should limit their sodium intake to 1,500-2,000 mg (milligrams) of sodium each day.  Canned, boxed, and frozen foods are high in sodium. Restaurant foods, fast foods, and pizza are also very high in sodium. You also get sodium by adding salt to food.  Try to cook at home, eat  more fresh fruits and vegetables, and eat less fast food, canned, processed, or prepared foods. This information is not intended to replace advice given to you by your health care provider. Make sure you discuss any questions you have with your health care provider. Document Released: 05/24/2002 Document Revised: 11/25/2016  Document Reviewed: 11/25/2016 Elsevier Interactive Patient Education  2018 [place], Ermalinda Barrios, [place]  03/25/2018 1:56 PM    [facility] [address], [place], [place]  [address] Phone: [phone]; Fax: [phone]

## 2018-03-26 ENCOUNTER — Telehealth: Payer: Self-pay

## 2018-03-26 ENCOUNTER — Other Ambulatory Visit: Payer: Self-pay

## 2018-03-26 ENCOUNTER — Ambulatory Visit (INDEPENDENT_AMBULATORY_CARE_PROVIDER_SITE_OTHER)
Admission: RE | Admit: 2018-03-26 | Discharge: 2018-03-26 | Disposition: A | Discharge status: Home / Self Care | Payer: Medicare Other | Source: Ambulatory Visit | Attending: Physician Assistant | Admitting: Physician Assistant

## 2018-03-26 ENCOUNTER — Other Ambulatory Visit: Payer: Medicare Other

## 2018-03-26 ENCOUNTER — Ambulatory Visit (HOSPITAL_COMMUNITY): Payer: Medicare Other | Attending: Cardiovascular Disease

## 2018-03-26 DIAGNOSIS — E782 Mixed hyperlipidemia: Secondary | ICD-10-CM

## 2018-03-26 DIAGNOSIS — I10 Essential (primary) hypertension: Secondary | ICD-10-CM

## 2018-03-26 DIAGNOSIS — I110 Hypertensive heart disease with heart failure: Secondary | ICD-10-CM | POA: Diagnosis present

## 2018-03-26 DIAGNOSIS — R0602 Shortness of breath: Secondary | ICD-10-CM

## 2018-03-26 DIAGNOSIS — I2510 Atherosclerotic heart disease of native coronary artery without angina pectoris: Secondary | ICD-10-CM

## 2018-03-26 DIAGNOSIS — E785 Hyperlipidemia, unspecified: Secondary | ICD-10-CM | POA: Insufficient documentation

## 2018-03-26 DIAGNOSIS — Z6841 Body Mass Index (BMI) 40.0 and over, adult: Secondary | ICD-10-CM | POA: Diagnosis not present

## 2018-03-26 DIAGNOSIS — I5033 Acute on chronic diastolic (congestive) heart failure: Secondary | ICD-10-CM

## 2018-03-26 LAB — PRO B NATRIURETIC PEPTIDE: NT-Pro BNP: 90 pg/mL (ref 0–376)

## 2018-03-26 MED ORDER — PERFLUTREN LIPID MICROSPHERE
1.0000 mL | INTRAVENOUS | Status: AC | PRN
  Administered 2018-03-26: 2 mL via INTRAVENOUS

## 2018-03-26 MED ORDER — IOPAMIDOL (ISOVUE-370) INJECTION 76%
100.0000 mL | Freq: Once | INTRAVENOUS | Status: AC | PRN
  Administered 2018-03-26: 80 mL via INTRAVENOUS

## 2018-03-26 NOTE — Telephone Encounter (Signed)
Left message for patient to call back. Put in referral for weight management per Ermalinda Barrios. Called to tell patient that he does not have a PE per CT. Will forward to Michele's nurse.

## 2018-03-26 NOTE — Telephone Encounter (Signed)
Patient called back informed patient that he did not have a PE and that Ermalinda Barrios PA has referred him to Medical Weight [facility]. Patient verbalized understanding and will call with any other questions.

## 2018-04-22 ENCOUNTER — Encounter: Payer: Self-pay | Admitting: Physician Assistant

## 2018-04-22 ENCOUNTER — Ambulatory Visit: Payer: Medicare Other | Admitting: Physician Assistant

## 2018-04-22 VITALS — BP 116/62 | HR 103 | Ht 74.0 in | Wt 318.0 lb

## 2018-04-22 DIAGNOSIS — I10 Essential (primary) hypertension: Secondary | ICD-10-CM | POA: Diagnosis not present

## 2018-04-22 DIAGNOSIS — E782 Mixed hyperlipidemia: Secondary | ICD-10-CM

## 2018-04-22 DIAGNOSIS — I5032 Chronic diastolic (congestive) heart failure: Secondary | ICD-10-CM

## 2018-04-22 DIAGNOSIS — I2510 Atherosclerotic heart disease of native coronary artery without angina pectoris: Secondary | ICD-10-CM | POA: Diagnosis not present

## 2018-04-22 NOTE — Progress Notes (Signed)
Cardiology Office Note    Date:  04/22/2018   ID:  Stuart Haynes, DOB 16-Jul-1946, MRN [id number]  PCP:  Burnard Bunting, MD  Cardiologist: Lauree Chandler, MD  No chief complaint on file.   History of Present Illness:  Stuart Haynes is a 72 y.o. male with a history of morbid obesity, hypertension, HLD, had a cath in 2012 showing normal LV function and mild nonobstructive CAD. Was seen in 2015 in the hospital for chest pain and had a negative Myoview and 2D echo showed normal LVEF grade 1 DD. Risk factor modification recommended.   I saw the patient is a new patient 03/25/2018 for acute on chronic diastolic CHF.  BNP was only 90.  I increased Lasix to 80 mg daily for 5 days then back to 40 mg daily.  Also increase potassium.  He was eating a lot of processed foods.  He also had new right bundle branch block and tachycardia so I checked a CT to rule out PE which was negative for PE.  There was aortic atherosclerosis in the aorta, small nodular lesions in the thyroid without dominant mass.  2D echo showed normal LVEF 60 to 65% with grade 1 DD.  Ascending aortic diameter 39 mm, mildly dilated.  Exercise and weight loss program discussed in detail with patient. I referred him to medical weight loss management program.  Patient comes in today for follow-up.  Says his swelling is up and down.  Trying to follow low-sodium diet.  Missed the call from the weight loss program and lost their phone number.  Past Medical History:  Diagnosis Date  . Arthritis    "just about q joint I've got" (02/02/2014)  . Bulging lumbar disc   . GERD (gastroesophageal reflux disease)   . HTN (hypertension)   . Hyperlipidemia   . 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 Wheezing    "at my throat; don't know what it's from" (02/02/2014)    Past Surgical History:  Procedure Laterality Date  . CARDIAC CATHETERIZATION  ?2012   . INGUINAL HERNIA REPAIR Right   . MELANOMA EXCISION     "2 removed from my face; 1 removed from center of my back" (02/02/2014)  . TOTAL KNEE ARTHROPLASTY Left 2011    Current Medications: Current Meds  Medication Sig  . amLODipine (NORVASC) 10 MG tablet Take 10 mg by mouth daily.    Marland Kitchen doxazosin (CARDURA) 4 MG tablet Take 4 mg by mouth at bedtime.    . furosemide (LASIX) 40 MG tablet Take 40 mg by mouth daily.  Marland Kitchen levothyroxine (SYNTHROID, LEVOTHROID) 25 MCG tablet Take 25 mcg by mouth daily before breakfast.  . losartan (COZAAR) 100 MG tablet Take 1 tablet by mouth daily.  . Naproxen Sodium (ALEVE) 220 MG CAPS Take 440 mg by mouth 2 (two) times daily as needed. Pain.  . nitroGLYCERIN (NITROSTAT) 0.4 MG SL tablet Place 1 tablet (0.4 mg total) under the tongue every 5 (five) minutes as needed for chest pain.  . potassium chloride SA (K-DUR,KLOR-CON) 20 MEQ tablet Take 20 mEq by mouth 2 (two) times daily.  . simvastatin (ZOCOR) 20 MG tablet Take 20 mg by mouth at bedtime.       Allergies:   Patient has no known allergies.   Social History   Socioeconomic History  . Marital status: Married  Spouse name: Not on file  . Number of children: 2  . Years of education: Not on file  . Highest education level: Not on file  Occupational History  . Occupation: Retired Film/video editor  . Financial resource strain: Not on file  . Food insecurity:    Worry: Not on file    Inability: Not on file  . Transportation needs:    Medical: Not on file    Non-medical: Not on file  Tobacco Use  . Smoking status: Never Smoker  . Smokeless tobacco: Never Used  Substance and Sexual Activity  . Alcohol use: Yes    Comment: 02/02/2014 "used to have a beer once in awhile; haven't had one in awhile now"  . Drug use: No  . Sexual activity: Not Currently  Lifestyle  . Physical activity:    Days per week: Not on file    Minutes per session: Not on file  . Stress: Not on file  Relationships    . Social connections:    Talks on phone: Not on file    Gets together: Not on file    Attends religious service: Not on file    Active member of club or organization: Not on file    Attends meetings of clubs or organizations: Not on file    Relationship status: Not on file  Other Topics Concern  . Not on file  Social History Narrative  . Not on file     Family History:  The patient's family history includes Cancer in his father; Colon cancer in his mother; Hypertension in his mother.   ROS:   Please see the history of present illness.    Review of Systems  Constitution: Positive for malaise/fatigue and weight gain.  HENT: Negative.   Cardiovascular: Positive for leg swelling.  Respiratory: Negative.   Endocrine: Negative.   Hematologic/Lymphatic: Negative.   Musculoskeletal: Negative.   Gastrointestinal: Negative.   Genitourinary: Negative.   Neurological: Negative.    All other systems reviewed and are negative.   PHYSICAL EXAM:   VS:  BP 116/62   Pulse (!) 103   Ht 6\' 2"  (1.88 m)   Wt (!) 318 lb (144.2 kg)   SpO2 98%   BMI 40.83 kg/m   Physical Exam  GEN: Obese, in no acute distress  Neck: no JVD, carotid bruits, or masses Cardiac:RRR; distant heart sounds no murmurs, rubs, or gallops  Respiratory:  clear to auscultation bilaterally, normal work of breathing GI: soft, nontender, nondistended, + BS Ext: +3 edema bilaterally Neuro:  Alert and Oriented x 3 Psych: euthymic mood, full affect  Wt Readings from Last 3 Encounters:  04/22/18 (!) 318 lb (144.2 kg)  03/25/18 (!) 317 lb 6.4 oz (144 kg)  02/04/14 (!) 330 lb 3.2 oz (149.8 kg)      Studies/Labs Reviewed:   EKG:  EKG is not ordered today.   Recent Labs: 03/25/2018: NT-Pro BNP 90   Lipid Panel    Component Value Date/Time   CHOL 128 02/03/2014 0450   TRIG 169 (H) 02/03/2014 0450   HDL 32 (L) 02/03/2014 0450   CHOLHDL 4.0 02/03/2014 0450   VLDL 34 02/03/2014 0450   LDLCALC 62 02/03/2014 0450     Additional studies/ records that were reviewed today include:  2D echo 4/11/2019Study Conclusions   - Left ventricle: The cavity size was normal. Wall thickness was   increased in a pattern of mild LVH. Systolic function was normal.   The estimated  ejection fraction was in the range of 60% to 65%.   Wall motion was normal; there were no regional wall motion   abnormalities. Doppler parameters are consistent with abnormal   left ventricular relaxation (grade 1 diastolic dysfunction).   Doppler parameters are consistent with indeterminate ventricular   filling pressure. - Aortic valve: Transvalvular velocity was within the normal range.   There was no stenosis. There was no regurgitation. - Aorta: Ascending aortic diameter: 39 mm (S). - Ascending aorta: The ascending aorta was mildly dilated. - Mitral valve: Transvalvular velocity was within the normal range.   There was no evidence for stenosis. There was no regurgitation. - Right ventricle: The cavity size was normal. Wall thickness was   normal. Systolic function was normal. - Atrial septum: No defect or patent foramen ovale was identified   by color flow Doppler. - Tricuspid valve: There was trivial regurgitation. - Pulmonary arteries: Systolic pressure was within the normal   range. PA peak pressure: 19 mm Hg (S).   CT of the chest 4/11/2019IMPRESSION: 1. No demonstrable pulmonary embolus. No thoracic aortic aneurysm or dissection.   2. Aortic atherosclerosis noted in the aorta. There are foci of coronary artery calcification.   3. Areas of lower lobe and lingular atelectasis. No lung edema or consolidation.   4.  No appreciable adenopathy.   5.  Small nodular lesions in the thyroid without dominant mass.   6.  Mild unilateral gynecomastia on the right.   Aortic Atherosclerosis (ICD10-I70.0).    2D echo 02/03/14 Study Conclusions  - Left ventricle: The cavity size was normal. Wall thickness   was increased in a  pattern of mild LVH. Systolic function   was vigorous. The estimated ejection fraction was in the   range of 65% to 70%. Wall motion was normal; there were no   regional wall motion abnormalities. Doppler parameters are   consistent with abnormal left ventricular relaxation   (grade 1 diastolic dysfunction). - Aortic valve: Mildly calcified annulus. Trileaflet. No   significant regurgitation. - Mitral valve: Trivial regurgitation. - Left atrium: The atrium was mildly dilated. - Right ventricle: The cavity size was mildly dilated.   Systolic function was normal. Prominent apical   trabeculation. - Right atrium: The atrium was mildly dilated. Central   venous pressure: 41mm Hg (est). - Atrial septum: No defect or patent foramen ovale was   identified. - Tricuspid valve: Trivial regurgitation. - Pulmonary arteries: Systolic pressure could not be   accurately estimated. - Pericardium, extracardiac: There was no pericardial   effusion. Impressions:  - Mild LVH with LVEF 88-41%, grade 1 diastolic dysfunction.   Mild biatrial enlargement. Trivial mitral and tricuspid   regurgitation. Mild RV enlargement with normal contraction   and prominent apical trabeculation. Unable to assess PASP.    Lexiscan Myoview 2/20/15IMPRESSION: Low risk lexiscan myovue with no chest pain, no ST changes and prior inferior infarct vs diaphragmatic attenuation; cannot R/O very mild inferior ischemia; EF 67%.   Brain Crenshaw     Electronically Signed   By: [name]   On: 02/04/2014 14:16   Cardiac Cath 10/31/11:   Angiographic Findings:  Left main: No disease.  Left Anterior Descending Artery: 20% plaque proximal. 30% mid stenosis. Diagonal branch is small to moderate sized with mild 30% plaque.  Circumflex Artery: Mild plaque.  Right Coronary Artery: 30% proximal stenosis, eccentric.  Left Ventricular Angiogram: LVEF 55%.          ASSESSMENT:    1.  Chronic diastolic CHF (congestive  heart failure) ([place])   2. Essential hypertension   3. Coronary artery disease involving native coronary artery of native heart without angina pectoris   4. Mixed hyperlipidemia   5. Morbid obesity ([place])      PLAN:  In order of problems listed above:  Chronic diastolic CHF patient with significant edema last time I saw him, BNP normal and edema does not really improve with increased diuretics.  Believe weight loss management is going to be key for him.  Continue current dose Lasix.  Essential hypertension blood pressure controlled on Cozaar amlodipine and Cardura  CAD with nonobstructive disease on cath in 2012 no angina  Mixed hyperlipidemia continue Zocor  Morbid obesity referred to the weight loss management clinic.  I believe this is contributing to the majority of his problems. Patient missed their call and lost their number so hasn't followed up.  Have provided him the number to follow-up.    Medication Adjustments/Labs and Tests Ordered: Current medicines are reviewed at length with the patient today.  Concerns regarding medicines are outlined above.  Medication changes, Labs and Tests ordered today are listed in the Patient Instructions below. There are no Patient Instructions on file for this visit.   Signed, Ermalinda Barrios, PA-C  04/22/2018 1:40 PM    [facility] [address], [place], [place]  [address] Phone: [phone]; Fax: [phone]

## 2018-04-22 NOTE — Patient Instructions (Signed)
Medication Instructions:  Your physician recommends that you continue on your current medications as directed. Please refer to the Current Medication list given to you today.  Labwork: None ordered  Testing/Procedures: None ordered  Follow-Up: Your physician wants you to follow-up in: 6 months with Dr. Angelena Form.  You will receive a reminder letter in the mail two months in advance. If you don't receive a letter, please call our office to schedule the follow-up appointment.  * If you need a refill on your cardiac medications before your next appointment, please call your pharmacy.   *Please note that any paperwork needing to be filled out by the provider will need to be addressed at the front desk prior to seeing the provider. Please note that any FMLA, disability or other documents regarding health condition is subject to a $25.00 charge that must be received prior to completion of paperwork in the form of a money order or check.  Thank you for choosing [facility]!!    Any Other Special Instructions Will Be Listed Below (If Applicable). Dr. Leafy Ro # [phone]

## 2019-04-11 ENCOUNTER — Other Ambulatory Visit: Payer: Self-pay | Admitting: Physician Assistant

## 2019-06-27 ENCOUNTER — Other Ambulatory Visit: Payer: Self-pay | Admitting: Physician Assistant

## 2019-11-14 ENCOUNTER — Other Ambulatory Visit: Payer: Self-pay | Admitting: Physician Assistant

## 2019-11-21 IMAGING — CT CT ANGIO CHEST
2 of 7 series · 18 of 46 positions shown · IV contrast (ISOVUE 370)
Comparison: Chest CT October 05, 2005; chest radiograph Luis Jose

CLINICAL DATA: Shortness of Breath

EXAM:
CT ANGIOGRAPHY CHEST WITH CONTRAST
TECHNIQUE: Multidetector CT imaging of the chest was performed using the
standard protocol during bolus administration of intravenous
contrast. Multiplanar CT image reconstructions and MIPs were
obtained to evaluate the vascular anatomy.
CONTRAST:  80mL [id number] IOPAMIDOL ([id number]) INJECTION 76%

[Series 5: thins · axial · 0.79mm/px · z∈[-340,-60]mm · 15 of 308 slices shown]
[im 14/308  lung]
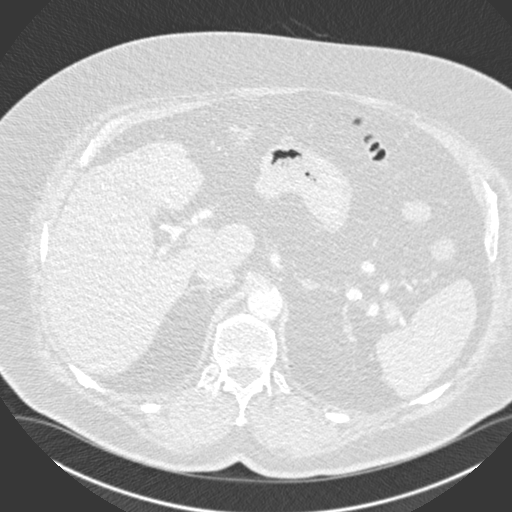
[im 41/308  soft-tissue]
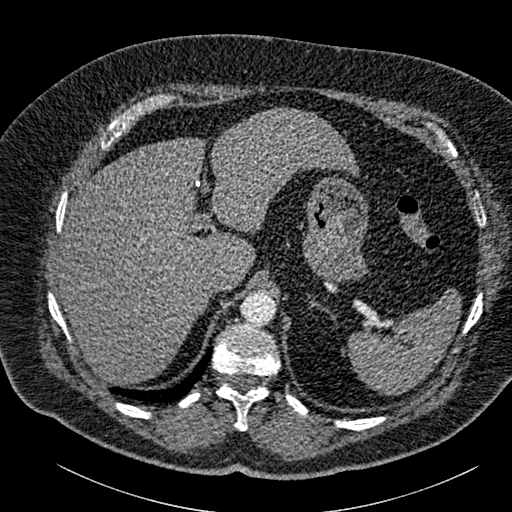
[im 54/308  lung]
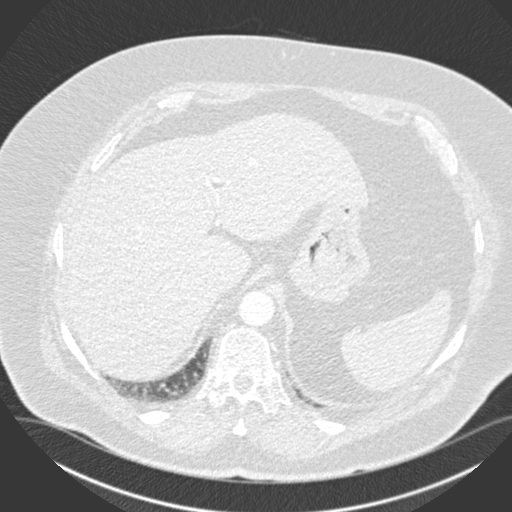
[im 81/308  soft-tissue]
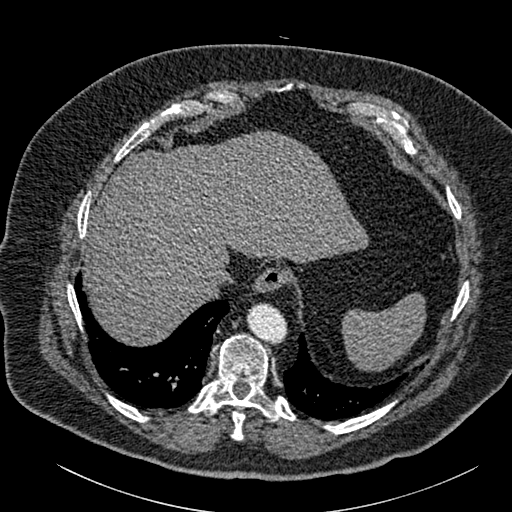
[im 94/308  lung]
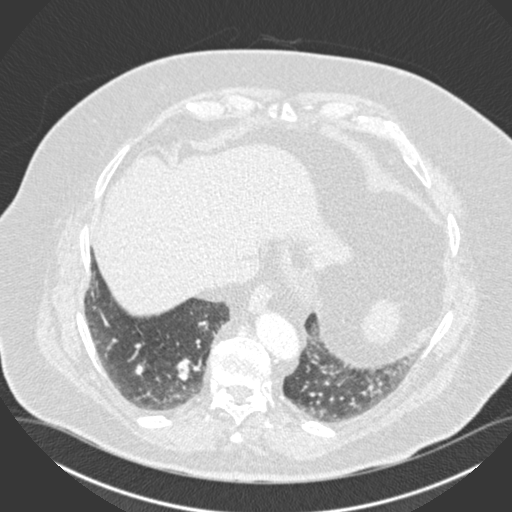
[im 121/308  soft-tissue]
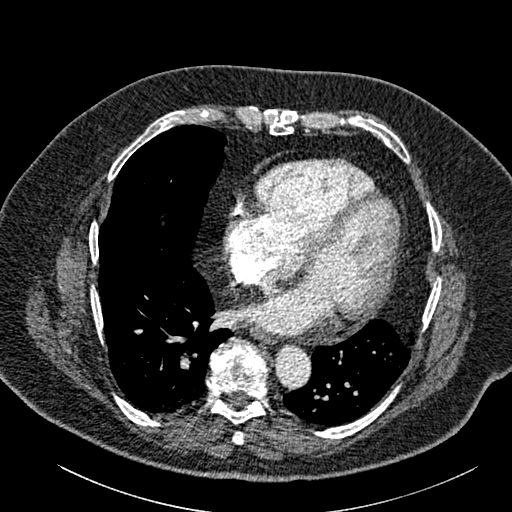
[im 134/308  lung]
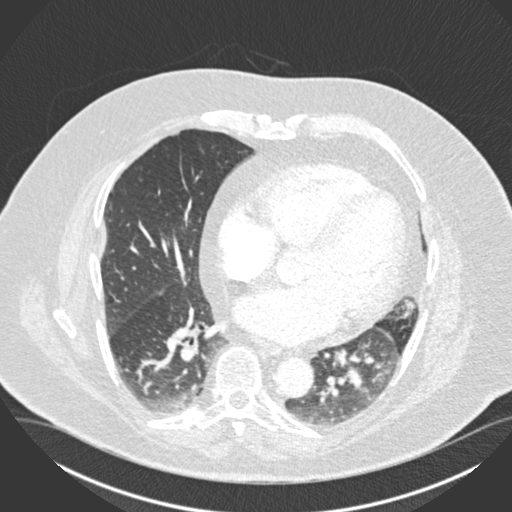
[im 161/308  soft-tissue]
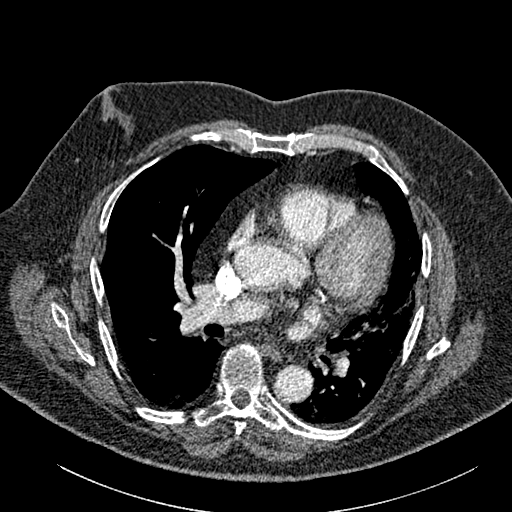
[im 174/308  lung]
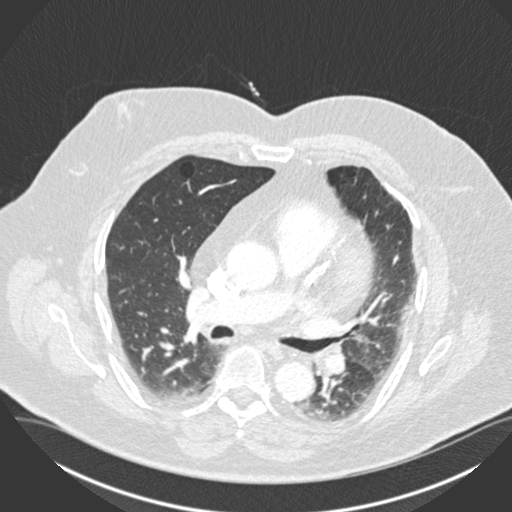
[im 187/308  soft-tissue]
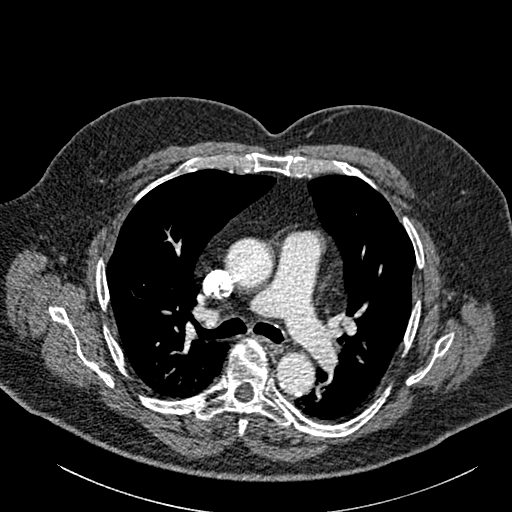
[im 214/308  lung]
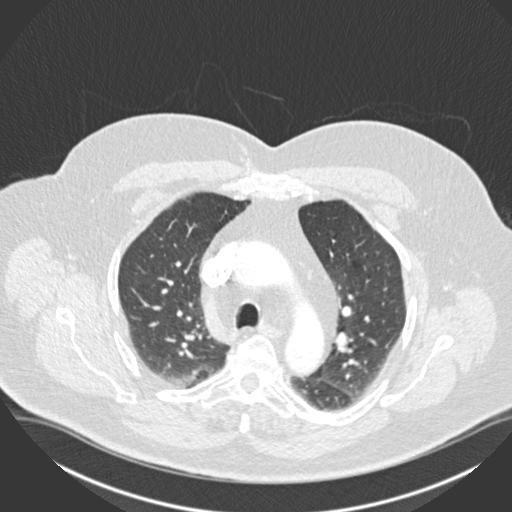
[im 227/308  soft-tissue]
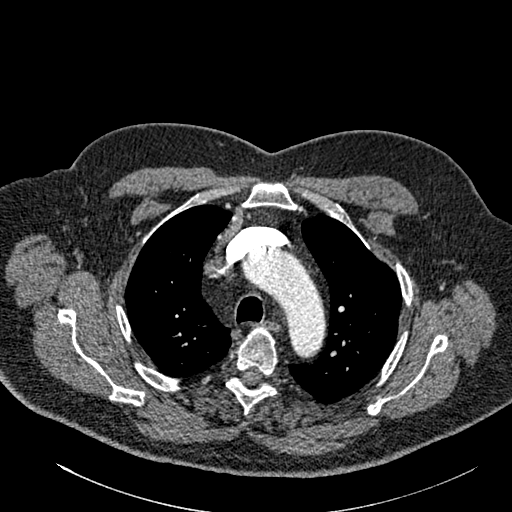
[im 254/308  lung]
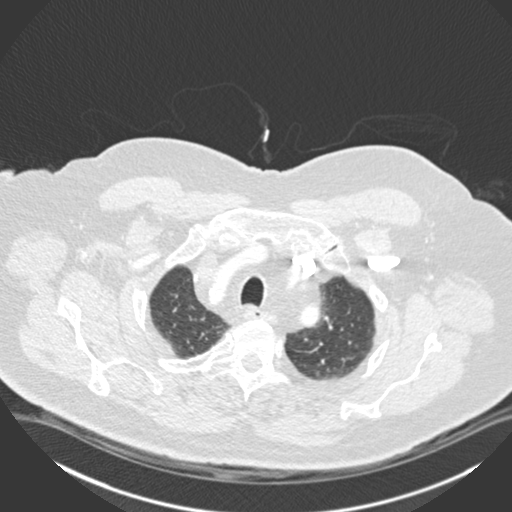
[im 267/308  soft-tissue]
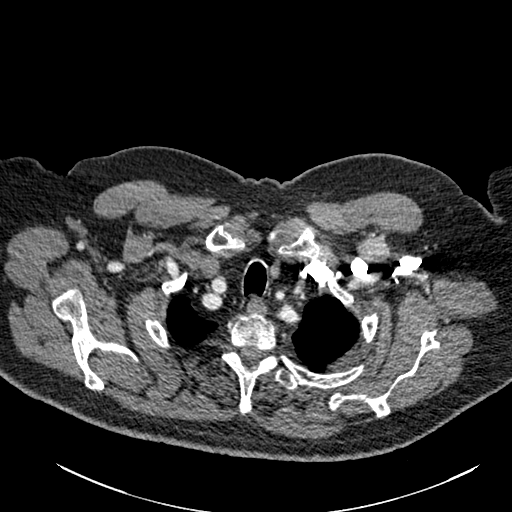
[im 294/308  lung]
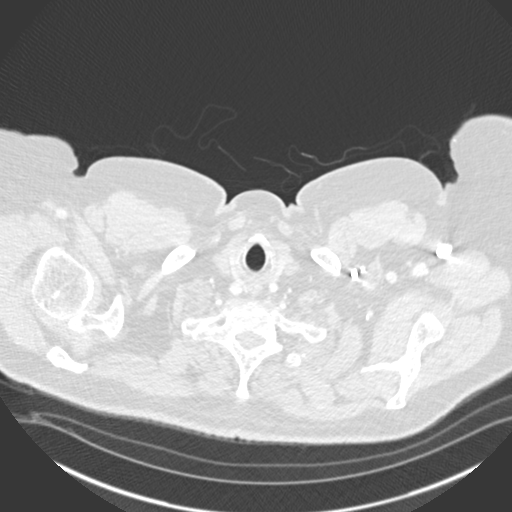

[Series 7: coronal mpr · coronal · 0.60mm/px · 3 of 143 slices shown]
[im 36/143  soft-tissue]
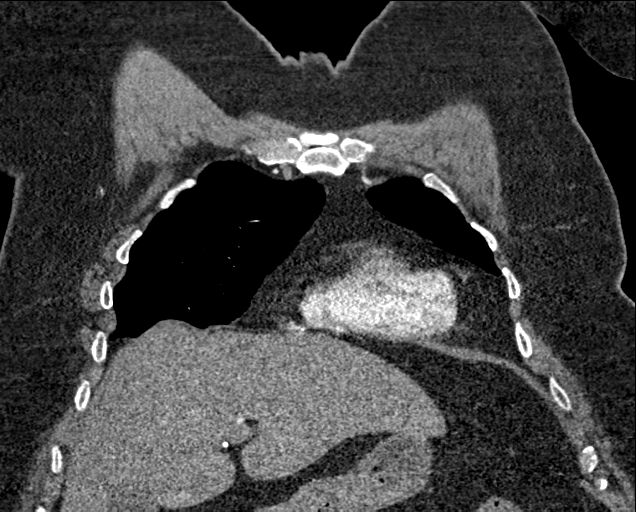
[im 72/143  soft-tissue]
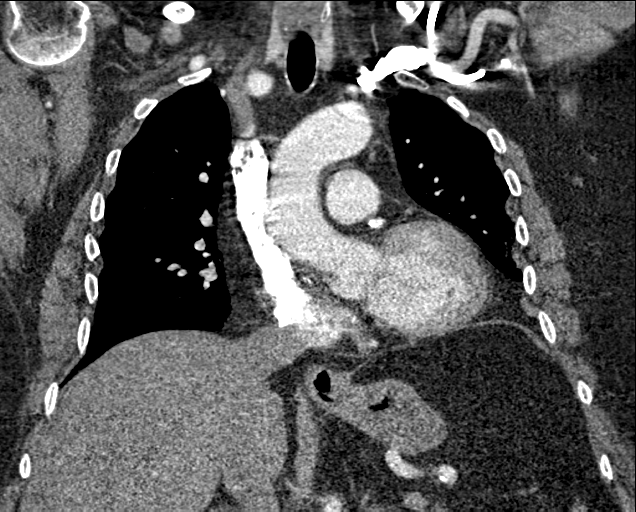
[im 107/143  soft-tissue]
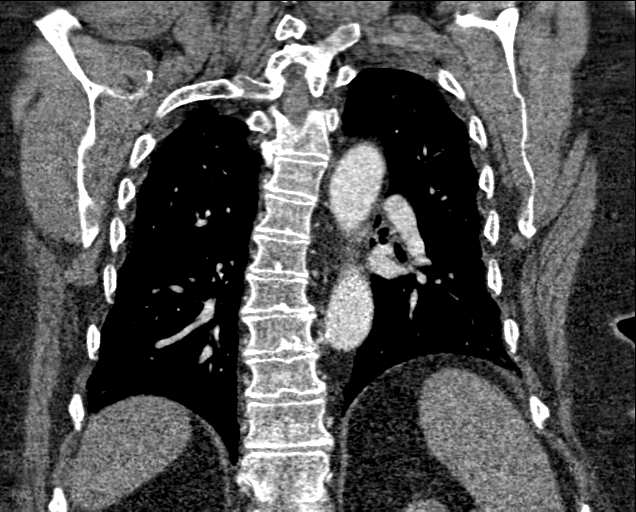

[18 of 46 positions shown; findings below may reference images not displayed]

FINDINGS: Cardiovascular: There is no demonstrable pulmonary embolus. There is
no thoracic aortic aneurysm or dissection. The visualized great
vessels appear unremarkable. There are foci of atherosclerotic
calcification in the aorta. There are foci of coronary artery
calcification. No pericardial effusion or pericardial thickening
evident.

Mediastinum/Nodes: There are subcentimeter nodular opacities in the
thyroid. There is no dominant thyroid mass. There is no appreciable
thoracic adenopathy. No esophageal lesions are evident.

Lungs/Pleura: There is a small bulla in the right lower lobe. There
is atelectatic change in the inferior lingula and lung bases. There
is no frank edema or consolidation. No pleural effusion or pleural
thickening evident.

Upper Abdomen: There is a cyst in the anterior segment of the right
lobe of the liver measuring 1.5 x 1.2 cm. There are scattered foci
of atherosclerotic calcification in the aorta and major mesenteric
arterial vessels. Visualized upper abdominal structures otherwise
appear normal.

Musculoskeletal: There are no blastic or lytic bone lesions. There
is upper lumbar levoscoliosis. There is a degree of gynecomastia on
the right.

Review of the MIP images confirms the above findings.
IMPRESSION: 1. No demonstrable pulmonary embolus. No thoracic aortic aneurysm or
dissection.

2. Aortic atherosclerosis noted in the aorta. There are foci of
coronary artery calcification.

3. Areas of lower lobe and lingular atelectasis. No lung edema or
consolidation.

4.  No appreciable adenopathy.

5.  Small nodular lesions in the thyroid without dominant mass.

6.  Mild unilateral gynecomastia on the right.

Aortic Atherosclerosis ([id number]-[id number]).

## 2019-12-01 ENCOUNTER — Other Ambulatory Visit: Payer: Self-pay

## 2019-12-01 MED ORDER — FUROSEMIDE 40 MG PO TABS: 40.0000 mg | ORAL_TABLET | Freq: Every day | ORAL | 0 refills | Status: AC

## 2020-03-09 ENCOUNTER — Encounter: Payer: Self-pay | Admitting: General Practice

## 2021-04-03 ENCOUNTER — Other Ambulatory Visit (HOSPITAL_COMMUNITY): Payer: Self-pay | Admitting: Adult Health

## 2021-04-03 ENCOUNTER — Other Ambulatory Visit: Payer: Self-pay

## 2021-04-03 ENCOUNTER — Ambulatory Visit (HOSPITAL_COMMUNITY)
Admission: RE | Admit: 2021-04-03 | Discharge: 2021-04-03 | Disposition: A | Discharge status: Home / Self Care | Payer: Medicare Other | Source: Ambulatory Visit | Attending: Adult Health | Admitting: Adult Health

## 2021-04-03 ENCOUNTER — Other Ambulatory Visit: Payer: Self-pay | Admitting: Adult Health

## 2021-04-03 DIAGNOSIS — N179 Acute kidney failure, unspecified: Secondary | ICD-10-CM

## 2021-04-03 DIAGNOSIS — M5410 Radiculopathy, site unspecified: Secondary | ICD-10-CM

## 2021-04-04 ENCOUNTER — Ambulatory Visit (HOSPITAL_COMMUNITY)
Admission: RE | Admit: 2021-04-04 | Discharge: 2021-04-04 | Disposition: A | Discharge status: Home / Self Care | Payer: Medicare Other | Source: Ambulatory Visit | Attending: Adult Health | Admitting: Adult Health

## 2021-04-04 DIAGNOSIS — N179 Acute kidney failure, unspecified: Secondary | ICD-10-CM | POA: Diagnosis not present

## 2021-04-04 NOTE — Progress Notes (Signed)
Renal artery duplex completed. Refer to "CV Proc" under chart review to view preliminary results.  04/04/2021 8:54 AM Kelby Aline., MHA, RVT, RDCS, RDMS

## 2021-04-26 ENCOUNTER — Telehealth: Payer: Self-pay

## 2021-04-26 ENCOUNTER — Telehealth: Payer: Self-pay | Admitting: *Deleted

## 2021-04-26 NOTE — Telephone Encounter (Signed)
   [facility] Pre-operative Risk Assessment    Patient Name: Decari Duggar  DOB: [birth year] MRN: [id number]   WILL SEND FYI TO REQUESTING OFFICE PT IS GOING TO NEED A NEW PT APPT. MESSAGE HAS BEEN SENT TO SCHEDULING.    HEARTCARE STAFF: - Please ensure there is not already an duplicate clearance open for this procedure. - Under Visit Info/Reason for Call, type in Other and utilize the format Clearance MM/DD/YY or Clearance TBD. Do not use dashes or single digits. - If request is for dental extraction, please clarify the # of teeth to be extracted.  Request for surgical clearance: PT WILL NEED A NEW PT APPT; LAST SEEN 04/22/18 BY CARDIOLOGY  1. What type of surgery is being performed? COLONOSCOPY   2. When is this surgery scheduled? 05/16/21   3. What type of clearance is required (medical clearance vs. Pharmacy clearance to hold med vs. Both)? MEDICAL  4. Are there any medications that need to be held prior to surgery and how long? NONE LISTED   5. Practice name and name of physician performing surgery? [facility]; DR. Scott   6. What is the office phone number? [phone]   7.   What is the office fax number? [phone]  8.   Anesthesia type (None, local, MAC, general) ? NOT LISTED   Julaine Hua 04/26/2021, 3:45 PM  _________________________________________________________________   (provider comments below)

## 2021-04-26 NOTE — Telephone Encounter (Signed)
NOTES ON FILE FROM  [facility] [phone], SENT REFERRAL TO SCHEDULING

## 2021-05-01 ENCOUNTER — Telehealth (HOSPITAL_COMMUNITY): Payer: Self-pay | Admitting: Internal Medicine

## 2021-05-01 ENCOUNTER — Telehealth (HOSPITAL_COMMUNITY): Payer: Self-pay | Admitting: Vascular Surgery

## 2021-05-01 NOTE — Telephone Encounter (Signed)
Spoke to [employer] , pt is not an appropriate canidiate to be seen in HF clinic pt would need to be referred to general cardiology

## 2021-05-01 NOTE — Telephone Encounter (Signed)
[facility] called to f/u with stat referral, please call [phone], if you did not receive the fax, Thanks

## 2021-05-10 ENCOUNTER — Encounter: Payer: Self-pay | Admitting: Cardiovascular Disease

## 2021-05-10 ENCOUNTER — Ambulatory Visit: Payer: Medicare Other | Admitting: Cardiovascular Disease

## 2021-05-10 ENCOUNTER — Other Ambulatory Visit: Payer: Self-pay

## 2021-05-10 DIAGNOSIS — I5032 Chronic diastolic (congestive) heart failure: Secondary | ICD-10-CM

## 2021-05-10 DIAGNOSIS — I10 Essential (primary) hypertension: Secondary | ICD-10-CM | POA: Diagnosis not present

## 2021-05-10 DIAGNOSIS — I2510 Atherosclerotic heart disease of native coronary artery without angina pectoris: Secondary | ICD-10-CM | POA: Diagnosis not present

## 2021-05-10 DIAGNOSIS — E782 Mixed hyperlipidemia: Secondary | ICD-10-CM

## 2021-05-10 NOTE — Assessment & Plan Note (Signed)
History of hyperlipidemia on statin therapy with lipid profile performed 11/22/2020 revealing total cholesterol 143, LDL 74 and HDL 41.
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
## 2021-05-10 NOTE — Assessment & Plan Note (Signed)
History of minimal nonobstructive CAD by cardiac cath in 2012.  He did have a negative Myoview in 2015.  He denies chest pain.

## 2021-05-10 NOTE — Assessment & Plan Note (Signed)
On furosemide.  He has 3+ pitting edema.  He does admit to salt indiscretion.

## 2021-05-10 NOTE — Patient Instructions (Signed)
Medication Instructions:  Your physician recommends that you continue on your current medications as directed. Please refer to the Current Medication list given to you today.  *If you need a refill on your cardiac medications before your next appointment, please call your pharmacy*   Follow-Up: At [facility], you and your health needs are our priority.  As part of our continuing mission to provide you with exceptional heart care, we have created designated Provider Care Teams.  These Care Teams include your primary Cardiologist (physician) and Advanced Practice Providers (APPs -  Physician Assistants and Nurse Practitioners) who all work together to provide you with the care you need, when you need it.  We recommend signing up for the patient portal called "MyChart".  Sign up information is provided on this After Visit Summary.  MyChart is used to connect with patients for Virtual Visits (Telemedicine).  Patients are able to view lab/test results, encounter notes, upcoming appointments, etc.  Non-urgent messages can be sent to your provider as well.   To learn more about what you can do with MyChart, go to [email or web address].    Your next appointment:   No future appointments at this time. We will see you on an as needed basis.   Provider:   Quay Burow, MD   Other Instructions Cleared for your colonoscopy at low cardiac risk.

## 2021-05-10 NOTE — Assessment & Plan Note (Signed)
BMI 41 

## 2021-05-10 NOTE — Assessment & Plan Note (Signed)
History of essential pretension blood pressure measured today 148/74.  He is on amlodipine and losartan.

## 2021-05-16 ENCOUNTER — Telehealth: Payer: Self-pay

## 2021-05-16 NOTE — Telephone Encounter (Signed)
Notes on file.
